# Patient Record
Sex: Female | Born: 1950 | Race: Black or African American | Hispanic: No | Smoking: Never smoker
Health system: Southern US, Community
[De-identification: ages and names within clinical notes are randomized; demographics above are authoritative.]

## PROBLEM LIST (undated history)

## (undated) DIAGNOSIS — I10 Essential (primary) hypertension: Secondary | ICD-10-CM

## (undated) DIAGNOSIS — I2510 Atherosclerotic heart disease of native coronary artery without angina pectoris: Secondary | ICD-10-CM

## (undated) DIAGNOSIS — E785 Hyperlipidemia, unspecified: Secondary | ICD-10-CM

## (undated) DIAGNOSIS — E119 Type 2 diabetes mellitus without complications: Secondary | ICD-10-CM

## (undated) DIAGNOSIS — I519 Heart disease, unspecified: Secondary | ICD-10-CM

## (undated) HISTORY — PX: ABDOMINAL HYSTERECTOMY: SHX81

## (undated) HISTORY — PX: CATARACT EXTRACTION: SUR2

## (undated) HISTORY — DX: Atherosclerotic heart disease of native coronary artery without angina pectoris: I25.10

## (undated) HISTORY — DX: Heart disease, unspecified: I51.9

## (undated) HISTORY — DX: Essential (primary) hypertension: I10

## (undated) HISTORY — DX: Type 2 diabetes mellitus without complications: E11.9

## (undated) HISTORY — PX: CARDIAC CATHETERIZATION: SHX172

## (undated) HISTORY — DX: Hyperlipidemia, unspecified: E78.5

## (undated) HISTORY — PX: KNEE SURGERY: SHX244

---

## 2018-12-06 DIAGNOSIS — E1165 Type 2 diabetes mellitus with hyperglycemia: Secondary | ICD-10-CM | POA: Insufficient documentation

## 2019-11-12 ENCOUNTER — Other Ambulatory Visit: Payer: Self-pay

## 2019-11-12 DIAGNOSIS — Z20822 Contact with and (suspected) exposure to covid-19: Secondary | ICD-10-CM

## 2019-11-13 LAB — NOVEL CORONAVIRUS, NAA: SARS-CoV-2, NAA: NOT DETECTED

## 2019-11-14 ENCOUNTER — Other Ambulatory Visit (HOSPITAL_COMMUNITY): Payer: Self-pay | Admitting: Student

## 2019-11-14 ENCOUNTER — Other Ambulatory Visit: Payer: Self-pay | Admitting: Student

## 2019-11-14 DIAGNOSIS — M549 Dorsalgia, unspecified: Secondary | ICD-10-CM

## 2019-11-14 DIAGNOSIS — M25559 Pain in unspecified hip: Secondary | ICD-10-CM

## 2019-11-25 ENCOUNTER — Ambulatory Visit (HOSPITAL_COMMUNITY): Payer: Medicare (Managed Care)

## 2019-12-03 ENCOUNTER — Encounter (HOSPITAL_COMMUNITY): Payer: Self-pay

## 2019-12-03 ENCOUNTER — Ambulatory Visit (HOSPITAL_COMMUNITY): Payer: Medicare (Managed Care)

## 2020-06-24 ENCOUNTER — Other Ambulatory Visit: Payer: Self-pay | Admitting: Urgent Care

## 2020-06-24 DIAGNOSIS — Z1231 Encounter for screening mammogram for malignant neoplasm of breast: Secondary | ICD-10-CM

## 2020-08-29 NOTE — Progress Notes (Signed)
Patient did not show for appointment.   

## 2020-08-31 ENCOUNTER — Encounter: Payer: Medicare Other | Admitting: Family

## 2021-04-12 ENCOUNTER — Other Ambulatory Visit: Payer: Self-pay | Admitting: Internal Medicine

## 2021-04-12 DIAGNOSIS — M810 Age-related osteoporosis without current pathological fracture: Secondary | ICD-10-CM

## 2021-04-12 DIAGNOSIS — Z1231 Encounter for screening mammogram for malignant neoplasm of breast: Secondary | ICD-10-CM

## 2021-04-30 ENCOUNTER — Ambulatory Visit
Admission: RE | Admit: 2021-04-30 | Discharge: 2021-04-30 | Disposition: A | Discharge status: Home / Self Care | Payer: Medicare Other | Source: Ambulatory Visit | Attending: Internal Medicine | Admitting: Internal Medicine

## 2021-04-30 DIAGNOSIS — Z1231 Encounter for screening mammogram for malignant neoplasm of breast: Secondary | ICD-10-CM

## 2021-05-03 ENCOUNTER — Other Ambulatory Visit: Payer: Self-pay | Admitting: Internal Medicine

## 2021-06-22 ENCOUNTER — Ambulatory Visit
Admission: RE | Admit: 2021-06-22 | Discharge: 2021-06-22 | Disposition: A | Discharge status: Home / Self Care | Payer: Medicare Other | Source: Ambulatory Visit | Attending: Internal Medicine | Admitting: Internal Medicine

## 2021-06-22 ENCOUNTER — Other Ambulatory Visit: Payer: Self-pay | Admitting: Internal Medicine

## 2021-06-22 DIAGNOSIS — R52 Pain, unspecified: Secondary | ICD-10-CM

## 2021-07-23 ENCOUNTER — Other Ambulatory Visit: Payer: Self-pay | Admitting: Internal Medicine

## 2021-08-06 ENCOUNTER — Other Ambulatory Visit: Payer: Self-pay | Admitting: Internal Medicine

## 2021-08-06 DIAGNOSIS — Z1231 Encounter for screening mammogram for malignant neoplasm of breast: Secondary | ICD-10-CM

## 2021-08-18 ENCOUNTER — Ambulatory Visit: Payer: Medicare Other | Admitting: Podiatry

## 2021-09-02 ENCOUNTER — Other Ambulatory Visit: Payer: Medicare Other

## 2021-09-03 ENCOUNTER — Ambulatory Visit: Payer: Medicare Other | Admitting: Podiatry

## 2021-09-03 DIAGNOSIS — B351 Tinea unguium: Secondary | ICD-10-CM | POA: Diagnosis not present

## 2021-09-03 DIAGNOSIS — M79674 Pain in right toe(s): Secondary | ICD-10-CM

## 2021-09-03 DIAGNOSIS — M79675 Pain in left toe(s): Secondary | ICD-10-CM

## 2021-09-03 NOTE — Progress Notes (Signed)
  Subjective:  Patient ID: Debbie Gonzales, female    DOB: [birth year],  MRN: [id number]  No chief complaint on file.  71 y.o. female returns for the above complaint.  Patient presents with thickened elongated dystrophic toenails x10 mild pain on palpation.  Hurts with ambulation.  Objective:  There were no vitals filed for this visit. Podiatric Exam: Vascular: dorsalis pedis and posterior tibial pulses are palpable bilateral. Capillary return is immediate. Temperature gradient is WNL. Skin turgor WNL  Sensorium: Normal Semmes Weinstein monofilament test. Normal tactile sensation bilaterally. Nail Exam: Pt has thick disfigured discolored nails with subungual debris noted bilateral entire nail hallux through fifth toenails.  Pain on palpation to the nails. Ulcer Exam: There is no evidence of ulcer or pre-ulcerative changes or infection. Orthopedic Exam: Muscle tone and strength are WNL. No limitations in general ROM. No crepitus or effusions noted.  Skin: No Porokeratosis. No infection or ulcers    Assessment & Plan:   1. Pain due to onychomycosis of toenails of both feet     Patient was evaluated and treated and all questions answered.  Onychomycosis with pain  -Nails palliatively debrided as below. -Educated on self-care  Procedure: Nail Debridement Rationale: pain  Type of Debridement: manual, sharp debridement. Instrumentation: Nail nipper, rotary burr. Number of Nails: 10  Procedures and Treatment: Consent by patient was obtained for treatment procedures. The patient understood the discussion of treatment and procedures well. All questions were answered thoroughly reviewed. Debridement of mycotic and hypertrophic toenails, 1 through 5 bilateral and clearing of subungual debris. No ulceration, no infection noted.  Return Visit-Office Procedure: Patient instructed to return to the office for a follow up visit 3 months for continued evaluation and treatment.  Nicholes Rough,  DPM    No follow-ups on file.

## 2021-12-22 DIAGNOSIS — E1169 Type 2 diabetes mellitus with other specified complication: Secondary | ICD-10-CM | POA: Diagnosis not present

## 2021-12-22 DIAGNOSIS — R2689 Other abnormalities of gait and mobility: Secondary | ICD-10-CM | POA: Diagnosis not present

## 2021-12-22 DIAGNOSIS — E7800 Pure hypercholesterolemia, unspecified: Secondary | ICD-10-CM | POA: Diagnosis not present

## 2022-01-06 DIAGNOSIS — E119 Type 2 diabetes mellitus without complications: Secondary | ICD-10-CM | POA: Diagnosis not present

## 2022-01-06 DIAGNOSIS — H52203 Unspecified astigmatism, bilateral: Secondary | ICD-10-CM | POA: Diagnosis not present

## 2022-01-06 DIAGNOSIS — Z961 Presence of intraocular lens: Secondary | ICD-10-CM | POA: Diagnosis not present

## 2022-01-06 DIAGNOSIS — H524 Presbyopia: Secondary | ICD-10-CM | POA: Diagnosis not present

## 2022-01-06 DIAGNOSIS — Z7984 Long term (current) use of oral hypoglycemic drugs: Secondary | ICD-10-CM | POA: Diagnosis not present

## 2022-01-26 DIAGNOSIS — D8489 Other immunodeficiencies: Secondary | ICD-10-CM | POA: Diagnosis not present

## 2022-01-26 DIAGNOSIS — Z0289 Encounter for other administrative examinations: Secondary | ICD-10-CM | POA: Diagnosis not present

## 2022-01-26 DIAGNOSIS — J300 Vasomotor rhinitis: Secondary | ICD-10-CM | POA: Diagnosis not present

## 2022-04-05 NOTE — Progress Notes (Deleted)
  Subjective:    Debbie Gonzales - 72 y.o. female MRN [id number]  Date of birth: May 12, 1950  HPI  Debbie Gonzales is to establish care.     Current issues and/or concerns: Ophthalmology  Podiatry   ROS per HPI     Health Maintenance:  Health Maintenance Due  Topic Date Due   Medicare Annual Wellness (AWV)  Never done   COVID-19 Vaccine (1) Never done   Hepatitis C Screening  Never done   DTaP/Tdap/Td (1 - Tdap) Never done   COLONOSCOPY (Pts 45-64yrs Insurance coverage will need to be confirmed)  Never done   Zoster Vaccines- Shingrix (1 of 2) Never done   Pneumonia Vaccine 49+ Years old (1 - PCV) Never done   DEXA SCAN  Never done   INFLUENZA VACCINE  10/05/2021     Past Medical History: There are no problems to display for this patient.     Social History      Family History  family history is not on file.   Medications: reviewed and updated   Objective:   Physical Exam There were no vitals taken for this visit. Physical Exam      Assessment & Plan:         Patient was given clear instructions to go to Emergency Department or return to medical center if symptoms don't improve, worsen, or new problems develop.The patient verbalized understanding.  I discussed the assessment and treatment plan with the patient. The patient was provided an opportunity to ask questions and all were answered. The patient agreed with the plan and demonstrated an understanding of the instructions.   The patient was advised to call back or seek an in-person evaluation if the symptoms worsen or if the condition fails to improve as anticipated.    Durene Fruits, NP 04/05/2022, 11:58 AM Primary Care at [facility]

## 2022-04-07 DIAGNOSIS — M25551 Pain in right hip: Secondary | ICD-10-CM | POA: Diagnosis not present

## 2022-04-07 DIAGNOSIS — M1611 Unilateral primary osteoarthritis, right hip: Secondary | ICD-10-CM | POA: Diagnosis not present

## 2022-04-08 ENCOUNTER — Ambulatory Visit: Payer: Medicare Other | Admitting: Family Medicine

## 2022-04-08 DIAGNOSIS — Z7689 Persons encountering health services in other specified circumstances: Secondary | ICD-10-CM

## 2022-04-12 DIAGNOSIS — L853 Xerosis cutis: Secondary | ICD-10-CM | POA: Diagnosis not present

## 2022-04-12 DIAGNOSIS — Z008 Encounter for other general examination: Secondary | ICD-10-CM | POA: Diagnosis not present

## 2022-04-12 DIAGNOSIS — Z760 Encounter for issue of repeat prescription: Secondary | ICD-10-CM | POA: Diagnosis not present

## 2022-04-26 DIAGNOSIS — Z23 Encounter for immunization: Secondary | ICD-10-CM | POA: Diagnosis not present

## 2022-04-26 DIAGNOSIS — I2510 Atherosclerotic heart disease of native coronary artery without angina pectoris: Secondary | ICD-10-CM | POA: Diagnosis not present

## 2022-04-26 DIAGNOSIS — M169 Osteoarthritis of hip, unspecified: Secondary | ICD-10-CM | POA: Diagnosis not present

## 2022-04-26 DIAGNOSIS — E1165 Type 2 diabetes mellitus with hyperglycemia: Secondary | ICD-10-CM | POA: Diagnosis not present

## 2022-04-26 DIAGNOSIS — I10 Essential (primary) hypertension: Secondary | ICD-10-CM | POA: Diagnosis not present

## 2022-04-29 DIAGNOSIS — E559 Vitamin D deficiency, unspecified: Secondary | ICD-10-CM | POA: Diagnosis not present

## 2022-04-29 DIAGNOSIS — I10 Essential (primary) hypertension: Secondary | ICD-10-CM | POA: Diagnosis not present

## 2022-04-29 DIAGNOSIS — I2510 Atherosclerotic heart disease of native coronary artery without angina pectoris: Secondary | ICD-10-CM | POA: Diagnosis not present

## 2022-04-29 DIAGNOSIS — E1165 Type 2 diabetes mellitus with hyperglycemia: Secondary | ICD-10-CM | POA: Diagnosis not present

## 2022-05-03 DIAGNOSIS — Z1331 Encounter for screening for depression: Secondary | ICD-10-CM | POA: Diagnosis not present

## 2022-05-03 DIAGNOSIS — I2510 Atherosclerotic heart disease of native coronary artery without angina pectoris: Secondary | ICD-10-CM | POA: Diagnosis not present

## 2022-05-03 DIAGNOSIS — Z0000 Encounter for general adult medical examination without abnormal findings: Secondary | ICD-10-CM | POA: Diagnosis not present

## 2022-05-03 DIAGNOSIS — I10 Essential (primary) hypertension: Secondary | ICD-10-CM | POA: Diagnosis not present

## 2022-05-03 DIAGNOSIS — Z1339 Encounter for screening examination for other mental health and behavioral disorders: Secondary | ICD-10-CM | POA: Diagnosis not present

## 2022-05-03 DIAGNOSIS — E1165 Type 2 diabetes mellitus with hyperglycemia: Secondary | ICD-10-CM | POA: Diagnosis not present

## 2022-05-03 DIAGNOSIS — R55 Syncope and collapse: Secondary | ICD-10-CM | POA: Diagnosis not present

## 2022-05-11 ENCOUNTER — Other Ambulatory Visit: Payer: Self-pay | Admitting: Family Medicine

## 2022-05-11 DIAGNOSIS — I129 Hypertensive chronic kidney disease with stage 1 through stage 4 chronic kidney disease, or unspecified chronic kidney disease: Secondary | ICD-10-CM | POA: Diagnosis not present

## 2022-05-11 DIAGNOSIS — R7989 Other specified abnormal findings of blood chemistry: Secondary | ICD-10-CM | POA: Diagnosis not present

## 2022-05-11 DIAGNOSIS — I10 Essential (primary) hypertension: Secondary | ICD-10-CM | POA: Diagnosis not present

## 2022-05-11 DIAGNOSIS — I2510 Atherosclerotic heart disease of native coronary artery without angina pectoris: Secondary | ICD-10-CM | POA: Diagnosis not present

## 2022-05-11 DIAGNOSIS — E1165 Type 2 diabetes mellitus with hyperglycemia: Secondary | ICD-10-CM | POA: Diagnosis not present

## 2022-05-11 DIAGNOSIS — E1121 Type 2 diabetes mellitus with diabetic nephropathy: Secondary | ICD-10-CM | POA: Diagnosis not present

## 2022-05-11 DIAGNOSIS — E559 Vitamin D deficiency, unspecified: Secondary | ICD-10-CM | POA: Diagnosis not present

## 2022-05-11 DIAGNOSIS — E1122 Type 2 diabetes mellitus with diabetic chronic kidney disease: Secondary | ICD-10-CM | POA: Diagnosis not present

## 2022-05-11 DIAGNOSIS — R55 Syncope and collapse: Secondary | ICD-10-CM

## 2022-05-11 DIAGNOSIS — E782 Mixed hyperlipidemia: Secondary | ICD-10-CM | POA: Diagnosis not present

## 2022-05-18 ENCOUNTER — Ambulatory Visit
Admission: RE | Admit: 2022-05-18 | Discharge: 2022-05-18 | Disposition: A | Discharge status: Home / Self Care | Payer: No Typology Code available for payment source | Source: Ambulatory Visit | Attending: Family Medicine | Admitting: Family Medicine

## 2022-05-18 DIAGNOSIS — R55 Syncope and collapse: Secondary | ICD-10-CM

## 2022-05-31 DIAGNOSIS — J309 Allergic rhinitis, unspecified: Secondary | ICD-10-CM | POA: Diagnosis not present

## 2022-05-31 DIAGNOSIS — E1122 Type 2 diabetes mellitus with diabetic chronic kidney disease: Secondary | ICD-10-CM | POA: Diagnosis not present

## 2022-05-31 DIAGNOSIS — I10 Essential (primary) hypertension: Secondary | ICD-10-CM | POA: Diagnosis not present

## 2022-05-31 DIAGNOSIS — F411 Generalized anxiety disorder: Secondary | ICD-10-CM | POA: Diagnosis not present

## 2022-05-31 DIAGNOSIS — G4700 Insomnia, unspecified: Secondary | ICD-10-CM | POA: Diagnosis not present

## 2022-05-31 DIAGNOSIS — I129 Hypertensive chronic kidney disease with stage 1 through stage 4 chronic kidney disease, or unspecified chronic kidney disease: Secondary | ICD-10-CM | POA: Diagnosis not present

## 2022-05-31 DIAGNOSIS — F331 Major depressive disorder, recurrent, moderate: Secondary | ICD-10-CM | POA: Diagnosis not present

## 2022-05-31 DIAGNOSIS — E782 Mixed hyperlipidemia: Secondary | ICD-10-CM | POA: Diagnosis not present

## 2022-06-06 ENCOUNTER — Other Ambulatory Visit: Payer: Self-pay | Admitting: Family Medicine

## 2022-06-06 DIAGNOSIS — Z1231 Encounter for screening mammogram for malignant neoplasm of breast: Secondary | ICD-10-CM

## 2022-06-09 ENCOUNTER — Other Ambulatory Visit: Payer: Self-pay | Admitting: Family Medicine

## 2022-06-09 ENCOUNTER — Encounter: Payer: Self-pay | Admitting: Podiatry

## 2022-06-09 ENCOUNTER — Ambulatory Visit
Admission: RE | Admit: 2022-06-09 | Discharge: 2022-06-09 | Disposition: A | Discharge status: Home / Self Care | Payer: No Typology Code available for payment source | Source: Ambulatory Visit | Attending: Family Medicine | Admitting: Family Medicine

## 2022-06-09 ENCOUNTER — Ambulatory Visit (INDEPENDENT_AMBULATORY_CARE_PROVIDER_SITE_OTHER): Payer: No Typology Code available for payment source | Admitting: Podiatry

## 2022-06-09 DIAGNOSIS — B351 Tinea unguium: Secondary | ICD-10-CM

## 2022-06-09 DIAGNOSIS — Z1231 Encounter for screening mammogram for malignant neoplasm of breast: Secondary | ICD-10-CM

## 2022-06-09 DIAGNOSIS — I10 Essential (primary) hypertension: Secondary | ICD-10-CM

## 2022-06-09 DIAGNOSIS — M79675 Pain in left toe(s): Secondary | ICD-10-CM

## 2022-06-09 DIAGNOSIS — M79674 Pain in right toe(s): Secondary | ICD-10-CM

## 2022-06-09 DIAGNOSIS — R55 Syncope and collapse: Secondary | ICD-10-CM

## 2022-06-09 NOTE — Progress Notes (Signed)
Patient presents subjective:   Patient ID: Debbie Gonzales, female   DOB: 72 y.o.   MRN: [id number]   HPI Was severely elongated nailbeds 1-5 both feet that are thick and can become painful   ROS      Objective:  Physical Exam  Neuro vas alert status intact thick yellow brittle nailbeds 1-5 both feet painful     Assessment:  Chronic mycotic nail infection with pain 1-5 both feet     Plan:  Debridement of nailbeds 1-5 both feet neurogenic bleeding reappoint routine care

## 2022-06-17 ENCOUNTER — Ambulatory Visit
Admission: RE | Admit: 2022-06-17 | Discharge: 2022-06-17 | Disposition: A | Discharge status: Home / Self Care | Payer: No Typology Code available for payment source | Source: Ambulatory Visit | Attending: Family Medicine | Admitting: Family Medicine

## 2022-06-17 DIAGNOSIS — I10 Essential (primary) hypertension: Secondary | ICD-10-CM

## 2022-06-17 DIAGNOSIS — R55 Syncope and collapse: Secondary | ICD-10-CM

## 2022-06-17 DIAGNOSIS — I6529 Occlusion and stenosis of unspecified carotid artery: Secondary | ICD-10-CM | POA: Diagnosis not present

## 2022-06-17 MED ORDER — IOPAMIDOL (ISOVUE-370) INJECTION 76%
75.0000 mL | Freq: Once | INTRAVENOUS | Status: AC | PRN
  Administered 2022-06-17: 75 mL via INTRAVENOUS

## 2022-06-20 DIAGNOSIS — I10 Essential (primary) hypertension: Secondary | ICD-10-CM | POA: Diagnosis not present

## 2022-06-23 DIAGNOSIS — Z23 Encounter for immunization: Secondary | ICD-10-CM | POA: Diagnosis not present

## 2022-06-23 DIAGNOSIS — I10 Essential (primary) hypertension: Secondary | ICD-10-CM | POA: Diagnosis not present

## 2022-06-23 DIAGNOSIS — I6509 Occlusion and stenosis of unspecified vertebral artery: Secondary | ICD-10-CM | POA: Diagnosis not present

## 2022-06-23 DIAGNOSIS — E1121 Type 2 diabetes mellitus with diabetic nephropathy: Secondary | ICD-10-CM | POA: Diagnosis not present

## 2022-06-23 DIAGNOSIS — I6529 Occlusion and stenosis of unspecified carotid artery: Secondary | ICD-10-CM | POA: Diagnosis not present

## 2022-06-23 DIAGNOSIS — E1165 Type 2 diabetes mellitus with hyperglycemia: Secondary | ICD-10-CM | POA: Diagnosis not present

## 2022-06-27 NOTE — Progress Notes (Deleted)
  Subjective:    Debbie Gonzales - 72 y.o. female MRN [id number]  Date of birth: Feb 15, 1951  HPI  Debbie Gonzales is to establish care.   Current issues and/or concerns: Podiatry   ROS per HPI     Health Maintenance:  Health Maintenance Due  Topic Date Due   Medicare Annual Wellness (AWV)  Never done   COVID-19 Vaccine (1) Never done   Hepatitis C Screening  Never done   DTaP/Tdap/Td (1 - Tdap) Never done   COLONOSCOPY (Pts 45-74yrs Insurance coverage will need to be confirmed)  Never done   Zoster Vaccines- Shingrix (1 of 2) Never done   Pneumonia Vaccine 30+ Years old (1 of 1 - PCV) Never done   DEXA SCAN  Never done     Past Medical History: There are no problems to display for this patient.     Social History      Family History  family history is not on file.   Medications: reviewed and updated   Objective:   Physical Exam There were no vitals taken for this visit. Physical Exam      Assessment & Plan:         Patient was given clear instructions to go to Emergency Department or return to medical center if symptoms don't improve, worsen, or new problems develop.The patient verbalized understanding.  I discussed the assessment and treatment plan with the patient. The patient was provided an opportunity to ask questions and all were answered. The patient agreed with the plan and demonstrated an understanding of the instructions.   The patient was advised to call back or seek an in-person evaluation if the symptoms worsen or if the condition fails to improve as anticipated.    Ricky Stabs, NP 06/27/2022, 12:02 PM Primary Care at [facility]

## 2022-07-05 ENCOUNTER — Ambulatory Visit: Payer: No Typology Code available for payment source | Admitting: Family

## 2022-07-05 DIAGNOSIS — Z7689 Persons encountering health services in other specified circumstances: Secondary | ICD-10-CM

## 2022-07-07 ENCOUNTER — Ambulatory Visit (INDEPENDENT_AMBULATORY_CARE_PROVIDER_SITE_OTHER): Payer: No Typology Code available for payment source | Admitting: Neurology

## 2022-07-07 VITALS — BP 121/70 | HR 87 | Ht 62.0 in | Wt 184.4 lb

## 2022-07-07 DIAGNOSIS — Z8659 Personal history of other mental and behavioral disorders: Secondary | ICD-10-CM | POA: Diagnosis not present

## 2022-07-07 DIAGNOSIS — Z0389 Encounter for observation for other suspected diseases and conditions ruled out: Secondary | ICD-10-CM | POA: Diagnosis not present

## 2022-07-07 DIAGNOSIS — Z0001 Encounter for general adult medical examination with abnormal findings: Secondary | ICD-10-CM | POA: Diagnosis not present

## 2022-07-07 DIAGNOSIS — Z79899 Other long term (current) drug therapy: Secondary | ICD-10-CM | POA: Diagnosis not present

## 2022-07-07 DIAGNOSIS — R799 Abnormal finding of blood chemistry, unspecified: Secondary | ICD-10-CM | POA: Diagnosis not present

## 2022-07-07 DIAGNOSIS — I6501 Occlusion and stenosis of right vertebral artery: Secondary | ICD-10-CM

## 2022-07-07 NOTE — Progress Notes (Signed)
[facility] [address] [place]. [place] [address] [phone]       OFFICE CONSULT NOTE  Ms. Debbie Gonzales Date of Birth:  31-Mar-1950 Medical Record Number:  [id number]   Referring MD: Maryelizabeth Rowan  Reason for Referral: Abnormal CT angiogram  HPI: Debbie Gonzales is a 72 year old African-American lady seen today for initial office consultation visit.  History is obtained from her and review of electronic medical records and I personally reviewed pertinent available imaging films in PACS.  Patient is not a very great historian.  She is naviculars she close Dr. 3 that she had previous episodes of seizures and disorientation 5 years ago with the last one being 8 months ago.  Patient with episodes.  Continues disorientation does not recognize does not have significant headaches or focal neurological symptoms.  The first 1 occurred when her mother died and patient went to the funeral but was wondering why she was  not sitting next to her.  After the funeral ceremony was over she  refused to get up and leave stating that acetaminophen has not been started  .  She had a somewhat similar episode  8 months ago and she was confused and her daughter had to be called to come and take her home Patient denies any headache, vertigo, dizziness, double vision, extremity or numbness during these episodes. She denies any prior history of strokes, TIAs, seizures, migraines, significant head injury with loss of consciousness. She underwent MR angiogram of the brain on 05/18/2022 which showed moderate stenosis of the proximal right M2 and distal right vertebral artery system poststenotic dilatation.  CT angiogram on 06/17/2022 showed intact most severe narrowing of the right dominant vertebral artery with tandem severe stenosis.  There was mild to moderate bilateral MCA stenosis and moderate left and mild right carotid siphon stenosis as well.  Patient has been on aspirin for stroke prevention  does take her medicines for hyperlipidemia diabetes, hypertension which all appear under good control. ROS:   14 system review of systems is positive for confusion, disorientation, memory difficulties, all other systems negative  PMH: No past medical history on file.  Social History:  Social History   Socioeconomic History   Marital status: Single    Spouse name: Not on file   Number of children: Not on file   Years of education: Not on file   Highest education level: Not on file  Occupational History   Not on file  Tobacco Use   Smoking status: Not on file   Smokeless tobacco: Not on file  Substance and Sexual Activity   Alcohol use: Not on file   Drug use: Not on file   Sexual activity: Not on file  Other Topics Concern   Not on file  Social History Narrative   Not on file   Social Determinants of Health   Financial Resource Strain: Not on file  Food Insecurity: Not on file  Transportation Needs: Not on file  Physical Activity: Not on file  Stress: Not on file  Social Connections: Not on file  Intimate Partner Violence: Not on file    Medications:   Current Outpatient Medications on File Prior to Visit  Medication Sig Dispense Refill   aspirin EC 81 MG tablet Take 81 mg by mouth daily.     atenolol (TENORMIN) 50 MG tablet Take 50 mg by mouth daily.     atorvastatin (LIPITOR) 40 MG tablet Take 40 mg by mouth daily.  benzonatate (TESSALON) 100 MG capsule Take 100 mg by mouth 2 (two) times daily as needed.     carvedilol (COREG) 3.125 MG tablet Take 3.125 mg by mouth 2 (two) times daily.     CELEBREX 200 MG capsule Take by mouth.     cephALEXin (KEFLEX) 500 MG capsule Take 500 mg by mouth 2 (two) times daily.     Cholecalciferol (VITAMIN D3) 25 MCG (1000 UT) CAPS Take 1 capsule by mouth daily.     diclofenac Sodium (VOLTAREN) 1 % GEL Apply 2 g of 1% gel to affected area 4 times daily.     famotidine (PEPCID) 20 MG tablet Take by mouth.     fluticasone (FLONASE)  50 MCG/ACT nasal spray Place into both nostrils.     glipiZIDE (GLUCOTROL) 10 MG tablet glipizide 10 mg tablet  TAKE 1 TABLET TWICE A DAY BY ORAL ROUTE FOR 90 DAYS.     hydrochlorothiazide (HYDRODIURIL) 25 MG tablet Take 25 mg by mouth daily.     Lancet Devices (ONETOUCH DELICA PLUS LANCING) MISC      Lancets (ONETOUCH DELICA PLUS LANCET33G) MISC Apply topically.     losartan (COZAAR) 100 MG tablet Take 100 mg by mouth daily.     meloxicam (MOBIC) 7.5 MG tablet meloxicam 7.5 mg tablet  TAKE 1 TABLET BY MOUTH EVERY DAY WITH MEALS     metFORMIN (GLUCOPHAGE) 1000 MG tablet Take 1,000 mg by mouth daily.     omeprazole (PRILOSEC) 20 MG capsule Take by mouth.     ONETOUCH ULTRA test strip      sitaGLIPtin-metformin (JANUMET) 50-1000 MG tablet Take by mouth.     No current facility-administered medications on file prior to visit.    Allergies:  Not on File  Physical Exam General: well developed, well nourished pleasant elderly African-American lady, seated, in no evident distress Head: head normocephalic and atraumatic.   Neck: supple with no carotid or supraclavicular bruits Cardiovascular: regular rate and rhythm, no murmurs Musculoskeletal: no deformity Skin:  no rash/petichiae Vascular:  Normal pulses all extremities  Neurologic Exam Mental Status: Awake and fully alert. Oriented to place and time. Recent and remote memory intact. Attention span, concentration and fund of knowledge appropriate. Mood and affect appropriate.  Diminished recall 2/3.  Able to name only 8 animals which can walk on 4 legs.  Clock drawing 4/4. Cranial Nerves: Fundoscopic exam reveals sharp disc margins. Pupils equal, briskly reactive to light. Extraocular movements full without nystagmus. Visual fields full to confrontation. Hearing intact. Facial sensation intact. Face, tongue, palate moves normally and symmetrically.  Motor: Normal bulk and tone. Normal strength in all tested extremity muscles. Sensory.:  intact to touch , pinprick , position and vibratory sensation.  Coordination: Rapid alternating movements normal in all extremities. Finger-to-nose and heel-to-shin performed accurately bilaterally. Gait and Station: Arises from chair without difficulty. Stance is normal. Gait demonstrates normal stride length and balance . Able to heel, toe and tandem walk with mild difficulty.  Reflexes: 1+ and symmetric. Toes downgoing.   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  History of recurrent episodes of confusion and disorientation of unclear etiology.     PLAN:I had a long discussion with the patient regarding recurrent episodes of transient confusion.  Differential diagnosis and evaluation plan and answered questions.  We also discussed the  results of CT angiogram and MRA brain findings of high-grade stenosis of right vertebral artery and risk for stroke.  I recommend she continue aspirin for stroke prevention and maintain aggressive risk factor modification with strict control of hypertension with blood pressure goal below 140/90, lipids with LDL cholesterol goal below 70 mg percent and diabetes with hemoglobin A1c goal below 6.5%.  Check EEG, CMP, CBC, lipid profile and hemoglobin A1c.  He will return for follow-up in 4 months or call earlier if necessary.  Greater than 50% time during this 45-minute consultation visit was spent on counseling and coordination of care about episodes of confusion, disorientation and findings of vertebral artery stenosis.  Delia Heady, MD Note: This document was prepared with digital dictation and possible smart phrase technology. Any transcriptional errors that result from this process are unintentional.

## 2022-07-07 NOTE — Patient Instructions (Signed)
I had a long discussion with the patient regarding recurrent episodes of transient confusion.  Differential diagnosis and evaluation plan and answered questions.  We also discussed the results of CT angiogram and MRA brain findings of high-grade stenosis of right vertebral artery and risk for stroke.  I recommend she continue aspirin for stroke prevention and maintain aggressive risk factor modification with strict control of hypertension with blood pressure goal below 140/90, lipids with LDL cholesterol goal below 70 mg percent and diabetes with hemoglobin A1c goal below 6.5%.  Check EEG, CMP, CBC, lipid profile and hemoglobin A1c.  He will return for follow-up in 4 months or call earlier if necessary.  Stroke Prevention Some medical conditions and behaviors can lead to a higher chance of having a stroke. You can help prevent a stroke by eating healthy, exercising, not smoking, and managing any medical conditions you have. Stroke is a leading cause of functional impairment. Primary prevention is particularly important because a majority of strokes are first-time events. Stroke changes the lives of not only those who experience a stroke but also their family and other caregivers. How can this condition affect me? A stroke is a medical emergency and should be treated right away. A stroke can lead to brain damage and can sometimes be life-threatening. If a person gets medical treatment right away, there is a better chance of surviving and recovering from a stroke. What can increase my risk? The following medical conditions may increase your risk of a stroke: Cardiovascular disease. High blood pressure (hypertension). Diabetes. High cholesterol. Sickle cell disease. Blood clotting disorders (hypercoagulable state). Obesity. Sleep disorders (obstructive sleep apnea). Other risk factors include: Being older than age 50. Having a history of blood clots, stroke, or mini-stroke (transient ischemic attack,  TIA). Genetic factors, such as race, ethnicity, or a family history of stroke. Smoking cigarettes or using other tobacco products. Taking birth control pills, especially if you also use tobacco. Heavy use of alcohol or drugs, especially cocaine and methamphetamine. Physical inactivity. What actions can I take to prevent this? Manage your health conditions High cholesterol levels. Eating a healthy diet is important for preventing high cholesterol. If cholesterol cannot be managed through diet alone, you may need to take medicines. Take any prescribed medicines to control your cholesterol as told by your health care provider. Hypertension. To reduce your risk of stroke, try to keep your blood pressure below 130/80. Eating a healthy diet and exercising regularly are important for controlling blood pressure. If these steps are not enough to manage your blood pressure, you may need to take medicines. Take any prescribed medicines to control hypertension as told by your health care provider. Ask your health care provider if you should monitor your blood pressure at home. Have your blood pressure checked every year, even if your blood pressure is normal. Blood pressure increases with age and some medical conditions. Diabetes. Eating a healthy diet and exercising regularly are important parts of managing your blood sugar (glucose). If your blood sugar cannot be managed through diet and exercise, you may need to take medicines. Take any prescribed medicines to control your diabetes as told by your health care provider. Get evaluated for obstructive sleep apnea. Talk to your health care provider about getting a sleep evaluation if you snore a lot or have excessive sleepiness. Make sure that any other medical conditions you have, such as atrial fibrillation or atherosclerosis, are managed. Nutrition Follow instructions from your health care provider about what to eat or drink  to help manage your health  condition. These instructions may include: Reducing your daily calorie intake. Limiting how much salt (sodium) you use to 1,500 milligrams (mg) each day. Using only healthy fats for cooking, such as olive oil, canola oil, or sunflower oil. Eating healthy foods. You can do this by: Choosing foods that are high in fiber, such as whole grains, and fresh fruits and vegetables. Eating at least 5 servings of fruits and vegetables a day. Try to fill one-half of your plate with fruits and vegetables at each meal. Choosing lean protein foods, such as lean cuts of meat, poultry without skin, fish, tofu, beans, and nuts. Eating low-fat dairy products. Avoiding foods that are high in sodium. This can help lower blood pressure. Avoiding foods that have saturated fat, trans fat, and cholesterol. This can help prevent high cholesterol. Avoiding processed and prepared foods. Counting your daily carbohydrate intake.  Lifestyle If you drink alcohol: Limit how much you have to: 0-1 drink a day for women who are not pregnant. 0-2 drinks a day for men. Know how much alcohol is in your drink. In the [place] one drink equals one 12 oz bottle of beer ( ), one 5 oz glass of wine ( ), or one 1 oz glass of hard liquor (44mL). Do not use any products that contain nicotine or tobacco. These products include cigarettes, chewing tobacco, and vaping devices, such as e-cigarettes. If you need help quitting, ask your health care provider. Avoid secondhand smoke. Do not use drugs. Activity  Try to stay at a healthy weight. Get at least 30 minutes of exercise on most days, such as: Fast walking. Biking. Swimming. Medicines Take over-the-counter and prescription medicines only as told by your health care provider. Aspirin or blood thinners (antiplatelets or anticoagulants) may be recommended to reduce your risk of forming blood clots that can lead to stroke. Avoid taking birth control pills. Talk to your health  care provider about the risks of taking birth control pills if: You are over 57 years old. You smoke. You get very bad headaches. You have had a blood clot. Where to find more information American Stroke Association: www.strokeassociation.org Get help right away if: You or a loved one has any symptoms of a stroke. "BE FAST" is an easy way to remember the main warning signs of a stroke: B - Balance. Signs are dizziness, sudden trouble walking, or loss of balance. E - Eyes. Signs are trouble seeing or a sudden change in vision. F - Face. Signs are sudden weakness or numbness of the face, or the face or eyelid drooping on one side. A - Arms. Signs are weakness or numbness in an arm. This happens suddenly and usually on one side of the body. S - Speech. Signs are sudden trouble speaking, slurred speech, or trouble understanding what people say. T - Time. Time to call emergency services. Write down what time symptoms started. You or a loved one has other signs of a stroke, such as: A sudden, severe headache with no known cause. Nausea or vomiting. Seizure. These symptoms may represent a serious problem that is an emergency. Do not wait to see if the symptoms will go away. Get medical help right away. Call your local emergency services (911 in the [place] Do not drive yourself to the hospital. Summary You can help to prevent a stroke by eating healthy, exercising, not smoking, limiting alcohol intake, and managing any medical conditions you may have. Do not use any products that contain nicotine  or tobacco. These include cigarettes, chewing tobacco, and vaping devices, such as e-cigarettes. If you need help quitting, ask your health care provider. Remember "BE FAST" for warning signs of a stroke. Get help right away if you or a loved one has any of these signs. This information is not intended to replace advice given to you by your health care provider. Make sure you discuss any questions you have  with your health care provider. Document Revised: 09/05/2019 Document Reviewed: 09/23/2019 Elsevier Patient Education  2023 [employer].

## 2022-07-08 LAB — COMPREHENSIVE METABOLIC PANEL
ALT: 14 IU/L (ref 0–32)
AST: 23 IU/L (ref 0–40)
Albumin/Globulin Ratio: 2.1 (ref 1.2–2.2)
Albumin: 4.1 g/dL (ref 3.8–4.8)
Alkaline Phosphatase: 61 IU/L (ref 44–121)
BUN/Creatinine Ratio: 14 (ref 12–28)
BUN: 16 mg/dL (ref 8–27)
Bilirubin Total: <0.2 mg/dL (ref 0.0–1.2)
CO2: 23 mmol/L (ref 20–29)
Calcium: 9.5 mg/dL (ref 8.7–10.3)
Chloride: 104 mmol/L (ref 96–106)
Creatinine, Ser: 1.12 mg/dL — ABNORMAL HIGH (ref 0.57–1.00)
Globulin, Total: 2 g/dL (ref 1.5–4.5)
Glucose: 159 mg/dL — ABNORMAL HIGH (ref 70–99)
Potassium: 3.4 mmol/L — ABNORMAL LOW (ref 3.5–5.2)
Sodium: 144 mmol/L (ref 134–144)
Total Protein: 6.1 g/dL (ref 6.0–8.5)
eGFR: 52 mL/min/{1.73_m2} — ABNORMAL LOW (ref >59)

## 2022-07-08 LAB — LIPID PANEL
Chol/HDL Ratio: 2.6 ratio (ref 0.0–4.4)
Cholesterol, Total: 137 mg/dL (ref 100–199)
HDL: 53 mg/dL (ref >39)
LDL Chol Calc (NIH): 69 mg/dL (ref 0–99)
Triglycerides: 78 mg/dL (ref 0–149)
VLDL Cholesterol Cal: 15 mg/dL (ref 5–40)

## 2022-07-08 LAB — CBC
Hematocrit: 30.9 % — ABNORMAL LOW (ref 34.0–46.6)
Hemoglobin: 10.1 g/dL — ABNORMAL LOW (ref 11.1–15.9)
MCH: 27 pg (ref 26.6–33.0)
MCHC: 32.7 g/dL (ref 31.5–35.7)
MCV: 83 fL (ref 79–97)
Platelets: 250 10*3/uL (ref 150–450)
RBC: 3.74 x10E6/uL — ABNORMAL LOW (ref 3.77–5.28)
RDW: 15.7 % — ABNORMAL HIGH (ref 11.7–15.4)
WBC: 5.4 10*3/uL (ref 3.4–10.8)

## 2022-07-08 LAB — HEMOGLOBIN A1C
Est. average glucose Bld gHb Est-mCnc: 157 mg/dL
Hgb A1c MFr Bld: 7.1 % — ABNORMAL HIGH (ref 4.8–5.6)

## 2022-07-08 NOTE — Progress Notes (Signed)
Kindly inform the patient that cholesterol profile was satisfactory.  His potassium and blood count with hemoglobin is slightly low and he needs to see his primary care physician for further evaluation and treatment for the same Diabetes control is also not satisfactory and needs to see his primary care physician for medication adjustment

## 2022-07-12 ENCOUNTER — Telehealth: Payer: Self-pay | Admitting: Neurology

## 2022-07-12 ENCOUNTER — Telehealth: Payer: Self-pay

## 2022-07-12 NOTE — Telephone Encounter (Signed)
Contacted pt, informed her that cholesterol profile was satisfactory. Her potassium and blood count with hemoglobin is slightly low. Diabetes control is also not satisfactory and needs to see her primary care physician for medication adjustment and further management.  Advised to call the office back with any questions or concerns as she had none at this time.  Patient verbally understood the results was appreciative for the call.

## 2022-07-12 NOTE — Telephone Encounter (Signed)
-----   Message from Deatra James, RN sent at 07/11/2022  9:37 AM EDT -----  ----- Message ----- From: Micki Riley, MD Sent: 07/08/2022   8:33 AM EDT To: Gna-[facility] 2 Results  Kindly inform the patient that cholesterol profile was satisfactory.  His potassium and blood count with hemoglobin is slightly low and he needs to see his primary care physician for further evaluation and treatment for the same Diabetes control is also not satisfactory and needs to see his primary care physician for medication adjustment

## 2022-07-12 NOTE — Telephone Encounter (Signed)
[facility]kley Harvey: [id number] exp. 07/12/22-09/11/22 sent to GI [phone]

## 2022-07-14 DIAGNOSIS — M1611 Unilateral primary osteoarthritis, right hip: Secondary | ICD-10-CM | POA: Diagnosis not present

## 2022-07-19 ENCOUNTER — Ambulatory Visit: Payer: No Typology Code available for payment source | Admitting: Cardiology

## 2022-07-19 ENCOUNTER — Encounter: Payer: Self-pay | Admitting: Cardiology

## 2022-07-19 VITALS — BP 136/76 | HR 78 | Resp 16 | Ht 62.0 in | Wt 187.6 lb

## 2022-07-19 DIAGNOSIS — I2510 Atherosclerotic heart disease of native coronary artery without angina pectoris: Secondary | ICD-10-CM | POA: Diagnosis not present

## 2022-07-19 DIAGNOSIS — N1831 Chronic kidney disease, stage 3a: Secondary | ICD-10-CM

## 2022-07-19 DIAGNOSIS — I6523 Occlusion and stenosis of bilateral carotid arteries: Secondary | ICD-10-CM | POA: Diagnosis not present

## 2022-07-19 DIAGNOSIS — E7800 Pure hypercholesterolemia, unspecified: Secondary | ICD-10-CM | POA: Diagnosis not present

## 2022-07-19 DIAGNOSIS — R0789 Other chest pain: Secondary | ICD-10-CM | POA: Diagnosis not present

## 2022-07-19 DIAGNOSIS — I10 Essential (primary) hypertension: Secondary | ICD-10-CM

## 2022-07-19 DIAGNOSIS — E1122 Type 2 diabetes mellitus with diabetic chronic kidney disease: Secondary | ICD-10-CM | POA: Diagnosis not present

## 2022-07-19 MED ORDER — CARVEDILOL 6.25 MG PO TABS: 6.2500 mg | ORAL_TABLET | Freq: Two times a day (BID) | ORAL | 3 refills | Status: DC

## 2022-07-19 NOTE — Progress Notes (Addendum)
Primary Physician/Referring:  Lewis Moccasin, MD  Patient ID: Debbie Gonzales, female    DOB: 1950-06-15, 71 y.o.   MRN: [id number]  Chief Complaint  Patient presents with   vascular disease   New Patient (Initial Visit)   HPI:    Debbie Gonzales  is a 72 y.o. African-American female patient with coronary artery disease and stent implantation in 2012 while in [place], hypertension, hypercholesterolemia, diabetes mellitus with stage IIIa chronic kidney disease presenting with episodes of global amnesia for which she underwent CT angiogram of the brain which revealed bilateral ICA stenosis and high-grade tandem right vertebral stenosis, referred to me for cardiac evaluation in view of the vascular disease.  Past Medical History:  Diagnosis Date   Coronary artery disease    Diabetes mellitus without complication (HCC)    Heart disease    Hyperlipidemia    Hypertension    Past Surgical History:  Procedure Laterality Date   ABDOMINAL HYSTERECTOMY     CARDIAC CATHETERIZATION     CATARACT EXTRACTION N/A    KNEE SURGERY Left    Family History  Problem Relation Age of Onset   Heart failure Mother    Heart attack Mother    Diabetes Mother     Social History   Tobacco Use   Smoking status: Never   Smokeless tobacco: Never  Substance Use Topics   Alcohol use: Yes    Comment: occasionally   Marital Status: Single  ROS  Review of Systems  Cardiovascular:  Negative for chest pain, dyspnea on exertion and leg swelling.   Objective      07/19/2022    3:40 PM 07/19/2022    2:02 PM 07/07/2022    3:56 PM  Vitals with BMI  Height  5\' 2"  5\' 2"   Weight  187 lbs 10 oz 184 lbs 6 oz  BMI  34.3 33.72  Systolic 136 169 213  Diastolic 76 75 70  Pulse 78 78 87   SpO2: 94 %  Physical Exam Neck:     Vascular: Carotid bruit (bilateral) present. No JVD.  Cardiovascular:     Rate and Rhythm: Normal rate and regular rhythm.     Pulses: Intact distal pulses.     Heart  sounds: Normal heart sounds. No murmur heard.    No gallop.  Pulmonary:     Effort: Pulmonary effort is normal.     Breath sounds: Normal breath sounds.  Abdominal:     General: Bowel sounds are normal.     Palpations: Abdomen is soft.  Musculoskeletal:     Right lower leg: No edema.     Left lower leg: No edema.     Laboratory examination:   Lab Results  Component Value Date   NA 144 07/07/2022   K 3.4 (L) 07/07/2022   CO2 23 07/07/2022   GLUCOSE 159 (H) 07/07/2022   BUN 16 07/07/2022   CREATININE 1.12 (H) 07/07/2022   CALCIUM 9.5 07/07/2022   EGFR 52 (L) 07/07/2022    Recent Labs    07/07/22 1623  NA 144  K 3.4*  CL 104  CO2 23  GLUCOSE 159*  BUN 16  CREATININE 1.12*  CALCIUM 9.5    Lab Results  Component Value Date   GLUCOSE 159 (H) 07/07/2022   NA 144 07/07/2022   K 3.4 (L) 07/07/2022   CL 104 07/07/2022   CO2 23 07/07/2022   BUN 16 07/07/2022   CREATININE 1.12 (H) 07/07/2022   EGFR  52 (L) 07/07/2022   CALCIUM 9.5 07/07/2022   PROT 6.1 07/07/2022   ALBUMIN 4.1 07/07/2022   LABGLOB 2.0 07/07/2022   AGRATIO 2.1 07/07/2022   BILITOT <0.2 07/07/2022   ALKPHOS 61 07/07/2022   AST 23 07/07/2022   ALT 14 07/07/2022      Lab Results  Component Value Date   ALT 14 07/07/2022   AST 23 07/07/2022   ALKPHOS 61 07/07/2022   BILITOT <0.2 07/07/2022       Latest Ref Rng & Units 07/07/2022    4:23 PM  Hepatic Function  Total Protein 6.0 - 8.5 g/dL 6.1   Albumin 3.8 - 4.8 g/dL 4.1   AST 0 - 40 IU/L 23   ALT 0 - 32 IU/L 14   Alk Phosphatase 44 - 121 IU/L 61   Total Bilirubin 0.0 - 1.2 mg/dL <4.0     Lipid Panel Recent Labs    07/07/22 1623  CHOL 137  TRIG 78  LDLCALC 69  HDL 53  CHOLHDL 2.6    HEMOGLOBIN A1C Lab Results  Component Value Date   HGBA1C 7.1 (H) 07/07/2022   TSH No results for input(s): "TSH" in the last 8760 hours.  External labs:   Labs 06/21/2022:  Serum glucose 132 mg, sodium 142, potassium 3.8, BUN 20, creatinine  1.1, EGFR 53 mL, LFTs normal.  Labs 05/03/2022:  Total cholesterol 152, triglycerides 97, HDL 44, LDL 89.  Hb 11.4/HCT 34.0, platelets 303.  Vitamin D 55.5.  TSH normal at 0.58.  Radiology:   CT angio head 06/21/2022: 1. Intracranial atherosclerosis including severe tandem stenoses of the dominant right vertebral artery. 2. Moderate left and mild right carotid siphon stenoses. 3. Mild-to-moderate bilateral MCA stenoses.  Cardiac Studies:  C oronary artery disease and S/P OM-1stent 2.75x28 mm Xience)implantation in 2015  Regadenoson nuclear stress test 01/17/2019: Perfusion: No decreased activity in the left ventricle on stress  imaging to suggest reversible ischemia or infarction.  Wall Motion: Normal left ventricular wall motion. No left  ventricular dilation. Left Ventricular Ejection Fraction: 68 %    EKG:   EKG 07/19/2022: Normal sinus rhythm at the rate of 78 bpm, left atrial enlargement, normal axis.  Right bundle branch block.   Medications and allergies  Not on File   Medication list   Current Outpatient Medications:    aspirin EC 81 MG tablet, Take 81 mg by mouth daily., Disp: , Rfl:    atorvastatin (LIPITOR) 40 MG tablet, Take 40 mg by mouth daily., Disp: , Rfl:    Cholecalciferol (VITAMIN D3) 25 MCG (1000 UT) CAPS, Take 1 capsule by mouth daily., Disp: , Rfl:    fluticasone (FLONASE) 50 MCG/ACT nasal spray, Place into both nostrils., Disp: , Rfl:    hydrochlorothiazide (HYDRODIURIL) 25 MG tablet, Take 25 mg by mouth daily., Disp: , Rfl:    Lancet Devices (ONETOUCH DELICA PLUS LANCING) MISC, , Disp: , Rfl:    Lancets (ONETOUCH DELICA PLUS LANCET33G) MISC, Apply topically., Disp: , Rfl:    losartan (COZAAR) 100 MG tablet, Take 100 mg by mouth daily., Disp: , Rfl:    metFORMIN (GLUCOPHAGE) 1000 MG tablet, Take 1,000 mg by mouth daily., Disp: , Rfl:    omeprazole (PRILOSEC) 20 MG capsule, Take by mouth., Disp: , Rfl:    ONETOUCH ULTRA test strip, , Disp: , Rfl:     carvedilol (COREG) 6.25 MG tablet, Take 1 tablet (6.25 mg total) by mouth 2 (two) times daily., Disp: 180 tablet, Rfl: 3  Assessment     ICD-10-CM   1. Coronary artery disease involving native coronary artery of native heart without angina pectoris  I25.10 EKG 12-Lead    carvedilol (COREG) 6.25 MG tablet    2. Primary hypertension  I10 carvedilol (COREG) 6.25 MG tablet    3. Pure hypercholesterolemia  E78.00     4. Type 2 diabetes mellitus with stage 3a chronic kidney disease, without long-term current use of insulin (HCC)  E11.22    N18.31     5. Asymptomatic bilateral carotid artery stenosis  I65.23 PCV CAROTID DUPLEX (BILATERAL)       Orders Placed This Encounter  Procedures   EKG 12-Lead    Meds ordered this encounter  Medications   carvedilol (COREG) 6.25 MG tablet    Sig: Take 1 tablet (6.25 mg total) by mouth 2 (two) times daily.    Dispense:  180 tablet    Refill:  3    Medications Discontinued During This Encounter  Medication Reason   benzonatate (TESSALON) 100 MG capsule    CELEBREX 200 MG capsule    cephALEXin (KEFLEX) 500 MG capsule    diclofenac Sodium (VOLTAREN) 1 % GEL    famotidine (PEPCID) 20 MG tablet    glipiZIDE (GLUCOTROL) 10 MG tablet    meloxicam (MOBIC) 7.5 MG tablet    sitaGLIPtin-metformin (JANUMET) 50-1000 MG tablet    atenolol (TENORMIN) 50 MG tablet    carvedilol (COREG) 3.125 MG tablet Reorder     Recommendations:   Debbie Gonzales is a 72 y.o.African-American female patient with coronary artery disease and S/P OM-1stent 2.75x28 mm Xience)implantation in 2015 while in [place], hypertension, hypercholesterolemia, diabetes mellitus with stage IIIa chronic kidney disease presenting with episodes of global amnesia for which she underwent CT angiogram of the brain which revealed bilateral ICA stenosis and high-grade tandem right vertebral stenosis, referred to me for cardiac evaluation in view of the vascular disease.  1. Coronary artery  disease involving native coronary artery of native heart without angina pectoris Patient has had what appears to be NSTEMI and underwent angioplasty and stenting in 2012 while in [place], details not available, patient referred to me for establishing cardiac care.  She remains angina free, she has had a nuclear stress test in 2020 which was nonischemic with no wall motion abnormality. Her EKG is normal, blood pressure is elevated and also for cardiovascular protection will increase her carvedilol from 3.125 mg to 6.25 mg p.o. twice daily. - EKG 12-Lead - carvedilol (COREG) 6.25 MG tablet; Take 1 tablet (6.25 mg total) by mouth 2 (two) times daily.  Dispense: 180 tablet; Refill: 3  2. Primary hypertension Blood pressure was markedly elevated upon presentation but improved upon sitting, however still not at goal with >130 mmHg.  Coreg has been increased. - carvedilol (COREG) 6.25 MG tablet; Take 1 tablet (6.25 mg total) by mouth 2 (two) times daily.  Dispense: 180 tablet; Refill: 3  3. Pure hypercholesterolemia Lipids under excellent control with recent modification.  Reviewed external labs.  4. Type 2 diabetes mellitus with stage 3a chronic kidney disease, without long-term current use of insulin (HCC) Patient has stage IIIa chronic kidney disease, renal function has remained stable.  She is presently on 100 mg of losartan, continue the same.  5. Asymptomatic bilateral carotid artery stenosis I reviewed the CT angiogram of the head, she has prominent bilateral carotid bruit, will obtain carotid artery duplex to establish a baseline.  Patient is extremely complex, I will try to reconcile  her medications and also her problem list from multiple providers, I would like to see her back in 6 weeks for follow-up.  I also reviewed the notes from Dr. Pearlean Brownie, neurology.  This was a complex evaluation of the patient with multiple medical comorbidity and multiple physician engagement.  - PCV CAROTID DUPLEX  (BILATERAL); Future   Yates Decamp, MD, [facility] 07/20/2022, 5:04 PM Office: [phone]

## 2022-07-20 ENCOUNTER — Encounter: Payer: Self-pay | Admitting: Cardiology

## 2022-07-21 ENCOUNTER — Other Ambulatory Visit: Payer: Self-pay

## 2022-07-21 ENCOUNTER — Ambulatory Visit: Payer: No Typology Code available for payment source

## 2022-07-21 DIAGNOSIS — I6523 Occlusion and stenosis of bilateral carotid arteries: Secondary | ICD-10-CM

## 2022-07-25 NOTE — Progress Notes (Signed)
Carotid artery duplex 07/21/2022: Duplex suggests stenosis in the right internal carotid artery (1-15%). < 50% stenosis in the right external carotid artery. Duplex suggests stenosis in the left internal carotid artery (1-15%). <50%stenosis in the left external carotid artery. Mild diffuse homogeneous plaque noted in the bilateral carotid arteries. No significant stenosis noted in the extracranial cerebral vessels. Antegrade right vertebral artery flow. Antegrade left vertebral artery flow.  Will discuss on OV soon

## 2022-07-27 ENCOUNTER — Other Ambulatory Visit: Payer: No Typology Code available for payment source | Admitting: *Deleted

## 2022-07-27 ENCOUNTER — Ambulatory Visit (INDEPENDENT_AMBULATORY_CARE_PROVIDER_SITE_OTHER): Payer: No Typology Code available for payment source | Admitting: Neurology

## 2022-07-27 DIAGNOSIS — Z8659 Personal history of other mental and behavioral disorders: Secondary | ICD-10-CM

## 2022-07-27 DIAGNOSIS — R410 Disorientation, unspecified: Secondary | ICD-10-CM | POA: Diagnosis not present

## 2022-07-28 ENCOUNTER — Ambulatory Visit: Payer: No Typology Code available for payment source | Admitting: Internal Medicine

## 2022-08-03 ENCOUNTER — Telehealth: Payer: Self-pay | Admitting: Anesthesiology

## 2022-08-03 NOTE — Telephone Encounter (Signed)
Left message for pt to return call regarding EEG results.

## 2022-08-03 NOTE — Telephone Encounter (Signed)
-----   Message from Deatra James, RN sent at 08/03/2022 12:00 PM EDT -----  ----- Message ----- From: Micki Riley, MD Sent: 08/03/2022  11:55 AM EDT To: Gna-Pod 2 Results  Kindly inform the patient that EEG study was normal

## 2022-08-03 NOTE — Progress Notes (Signed)
Kindly inform the patient that EEG study was normal

## 2022-08-04 NOTE — Telephone Encounter (Signed)
Pt called back, the results off EEG were relayed to her, pt had no questions.

## 2022-08-23 ENCOUNTER — Ambulatory Visit
Admission: RE | Admit: 2022-08-23 | Discharge: 2022-08-23 | Disposition: A | Discharge status: Home / Self Care | Payer: No Typology Code available for payment source | Source: Ambulatory Visit | Attending: Neurology | Admitting: Neurology

## 2022-08-23 DIAGNOSIS — Z8659 Personal history of other mental and behavioral disorders: Secondary | ICD-10-CM | POA: Diagnosis not present

## 2022-08-23 DIAGNOSIS — I6501 Occlusion and stenosis of right vertebral artery: Secondary | ICD-10-CM | POA: Diagnosis not present

## 2022-08-23 MED ORDER — GADOPICLENOL 0.5 MMOL/ML IV SOLN
9.0000 mL | Freq: Once | INTRAVENOUS | Status: AC | PRN
  Administered 2022-08-23: 8 mL via INTRAVENOUS

## 2022-08-29 ENCOUNTER — Ambulatory Visit: Payer: No Typology Code available for payment source | Admitting: Cardiology

## 2022-08-29 ENCOUNTER — Encounter: Payer: Self-pay | Admitting: Cardiology

## 2022-08-29 VITALS — BP 120/67 | HR 70 | Ht 62.0 in | Wt 180.0 lb

## 2022-08-29 DIAGNOSIS — I6523 Occlusion and stenosis of bilateral carotid arteries: Secondary | ICD-10-CM

## 2022-08-29 DIAGNOSIS — I10 Essential (primary) hypertension: Secondary | ICD-10-CM

## 2022-08-29 DIAGNOSIS — E1122 Type 2 diabetes mellitus with diabetic chronic kidney disease: Secondary | ICD-10-CM | POA: Diagnosis not present

## 2022-08-29 DIAGNOSIS — I2510 Atherosclerotic heart disease of native coronary artery without angina pectoris: Secondary | ICD-10-CM | POA: Diagnosis not present

## 2022-08-29 DIAGNOSIS — N1831 Chronic kidney disease, stage 3a: Secondary | ICD-10-CM | POA: Diagnosis not present

## 2022-08-29 NOTE — Progress Notes (Signed)
Primary Physician/Referring:  Lewis Moccasin, MD  Patient ID: Debbie Gonzales, female    DOB: 11/01/1950, 72 y.o.   MRN: [id number]  Chief Complaint  Patient presents with   Coronary Artery Disease   Follow-up   Hypertension   carodit bruit   HPI:    Debbie Gonzales  is a 72 y.o. African-American female patient with coronary artery disease and stent implantation in 2012 while in [place], hypertension, hypercholesterolemia, diabetes mellitus with stage IIIa chronic kidney disease presenting with episodes of global amnesia for which she underwent CT angiogram of the brain which revealed bilateral ICA stenosis and high-grade tandem right vertebral stenosis, established with me 6 weeks ago for management of coronary artery disease and also vascular risk factors.  She remains asymptomatic.  And has been walking much at all  Past Medical History:  Diagnosis Date   Coronary artery disease    Diabetes mellitus without complication (HCC)    Heart disease    Hyperlipidemia    Hypertension    Past Surgical History:  Procedure Laterality Date   ABDOMINAL HYSTERECTOMY     CARDIAC CATHETERIZATION     CATARACT EXTRACTION N/A    KNEE SURGERY Left    Family History  Problem Relation Age of Onset   Heart failure Mother    Heart attack Mother    Diabetes Mother     Social History   Tobacco Use   Smoking status: Never   Smokeless tobacco: Never  Substance Use Topics   Alcohol use: Yes    Comment: occasionally   Marital Status: Single  ROS  Review of Systems  Cardiovascular:  Negative for chest pain, dyspnea on exertion and leg swelling.   Objective      08/29/2022   10:29 AM 07/19/2022    3:40 PM 07/19/2022    2:02 PM  Vitals with BMI  Height 5\' 2"   5\' 2"   Weight 180 lbs  187 lbs 10 oz  BMI 32.91  34.3  Systolic 120 136 409  Diastolic 67 76 75  Pulse 70 78 78   SpO2: 99 %  Physical Exam Neck:     Vascular: Carotid bruit (right) present. No JVD.   Cardiovascular:     Rate and Rhythm: Normal rate and regular rhythm.     Pulses: Intact distal pulses.     Heart sounds: Normal heart sounds. No murmur heard.    No gallop.  Pulmonary:     Effort: Pulmonary effort is normal.     Breath sounds: Normal breath sounds.  Abdominal:     General: Bowel sounds are normal.     Palpations: Abdomen is soft.  Musculoskeletal:     Right lower leg: No edema.     Left lower leg: No edema.    Laboratory examination:   Lab Results  Component Value Date   NA 144 07/07/2022   K 3.4 (L) 07/07/2022   CO2 23 07/07/2022   GLUCOSE 159 (H) 07/07/2022   BUN 16 07/07/2022   CREATININE 1.12 (H) 07/07/2022   CALCIUM 9.5 07/07/2022   EGFR 52 (L) 07/07/2022    Recent Labs    07/07/22 1623  NA 144  K 3.4*  CL 104  CO2 23  GLUCOSE 159*  BUN 16  CREATININE 1.12*  CALCIUM 9.5    Lab Results  Component Value Date   GLUCOSE 159 (H) 07/07/2022   NA 144 07/07/2022   K 3.4 (L) 07/07/2022   CL 104 07/07/2022  CO2 23 07/07/2022   BUN 16 07/07/2022   CREATININE 1.12 (H) 07/07/2022   EGFR 52 (L) 07/07/2022   CALCIUM 9.5 07/07/2022   PROT 6.1 07/07/2022   ALBUMIN 4.1 07/07/2022   LABGLOB 2.0 07/07/2022   AGRATIO 2.1 07/07/2022   BILITOT <0.2 07/07/2022   ALKPHOS 61 07/07/2022   AST 23 07/07/2022   ALT 14 07/07/2022      Lab Results  Component Value Date   ALT 14 07/07/2022   AST 23 07/07/2022   ALKPHOS 61 07/07/2022   BILITOT <0.2 07/07/2022       Latest Ref Rng & Units 07/07/2022    4:23 PM  Hepatic Function  Total Protein 6.0 - 8.5 g/dL 6.1   Albumin 3.8 - 4.8 g/dL 4.1   AST 0 - 40 IU/L 23   ALT 0 - 32 IU/L 14   Alk Phosphatase 44 - 121 IU/L 61   Total Bilirubin 0.0 - 1.2 mg/dL <4.0     Lipid Panel Recent Labs    07/07/22 1623  CHOL 137  TRIG 78  LDLCALC 69  HDL 53  CHOLHDL 2.6    HEMOGLOBIN A1C Lab Results  Component Value Date   HGBA1C 7.1 (H) 07/07/2022   No results found for: "TSH"   External labs:    Labs 06/21/2022:  Serum glucose 132 mg, sodium 142, potassium 3.8, BUN 20, creatinine 1.1, EGFR 53 mL, LFTs normal.  Labs 05/03/2022:  Total cholesterol 152, triglycerides 97, HDL 44, LDL 89.  Hb 11.4/HCT 34.0, platelets 303.  Vitamin D 55.5.  TSH normal at 0.58.  Radiology:   CT angio head 06/21/2022: 1. Intracranial atherosclerosis including severe tandem stenoses of the dominant right vertebral artery. 2. Moderate left and mild right carotid siphon stenoses. 3. Mild-to-moderate bilateral MCA stenoses.  Cardiac Studies:  Coronary artery disease and S/P OM-1stent 2.75x28 mm Xience)implantation in 2015  Regadenoson nuclear stress test 01/17/2019: Perfusion: No decreased activity in the left ventricle on stress  imaging to suggest reversible ischemia or infarction.  Wall Motion: Normal left ventricular wall motion. No left  ventricular dilation. Left Ventricular Ejection Fraction: 68 %   Carotid artery duplex 07/21/2022: Duplex suggests stenosis in the right internal carotid artery (1-15%). < 50% stenosis in the right external carotid artery. Duplex suggests stenosis in the left internal carotid artery (1-15%). <50% stenosis in the left external carotid artery. Mild diffuse homogeneous plaque noted in the bilateral carotid arteries. No significant stenosis noted in the extracranial cerebral vessels. Antegrade right vertebral artery flow. Antegrade left vertebral artery flow.  EKG:   EKG 07/19/2022: Normal sinus rhythm at the rate of 78 bpm, left atrial enlargement, normal axis.  Right bundle branch block.   Medications and allergies  No Known Allergies   Medication list   Current Outpatient Medications:    aspirin EC 81 MG tablet, Take 81 mg by mouth daily., Disp: , Rfl:    atorvastatin (LIPITOR) 40 MG tablet, Take 40 mg by mouth daily., Disp: , Rfl:    carvedilol (COREG) 6.25 MG tablet, Take 1 tablet (6.25 mg total) by mouth 2 (two) times daily., Disp: 180 tablet, Rfl:  3   Cholecalciferol (VITAMIN D3) 25 MCG (1000 UT) CAPS, Take 1 capsule by mouth daily., Disp: , Rfl:    fluticasone (FLONASE) 50 MCG/ACT nasal spray, Place into both nostrils., Disp: , Rfl:    hydrochlorothiazide (HYDRODIURIL) 25 MG tablet, Take 25 mg by mouth daily., Disp: , Rfl:    Lancet Devices [facility] [employer]  PLUS LANCING) MISC, , Disp: , Rfl:    Lancets (ONETOUCH DELICA PLUS LANCET33G) MISC, Apply topically., Disp: , Rfl:    losartan (COZAAR) 100 MG tablet, Take 100 mg by mouth daily., Disp: , Rfl:    metFORMIN (GLUCOPHAGE) 1000 MG tablet, Take 1,000 mg by mouth daily., Disp: , Rfl:    ONETOUCH ULTRA test strip, , Disp: , Rfl:    pioglitazone (ACTOS) 15 MG tablet, Take 1 tablet (15 mg total) by mouth daily., Disp: , Rfl:    Assessment     ICD-10-CM   1. Coronary artery disease involving native coronary artery of native heart without angina pectoris  I25.10     2. Primary hypertension  I10     3. Asymptomatic bilateral carotid artery stenosis  I65.23     4. 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   No orders of the defined types were placed in this encounter.   There are no discontinued medications.    Recommendations:   Debbie Gonzales is a 72 y.o.African-American female patient with coronary artery disease and S/P OM-1stent 2.75x28 mm Xience)implantation in 2015 while in [place], hypertension, hypercholesterolemia, diabetes mellitus with stage IIIa chronic kidney disease presenting with episodes of global amnesia for which she underwent CT angiogram of the brain which revealed bilateral ICA stenosis and high-grade tandem right vertebral stenosis.   1. Coronary artery disease involving native coronary artery of native heart without angina pectoris Patient has had what appears to be NSTEMI and underwent  angioplasty and stenting in 2012 while in [place], details not available, patient referred to me for establishing cardiac care.  She remains angina free, she has had a nuclear stress test in 2020 which was nonischemic with no wall motion abnormality. Her EKG is normal, blood pressure is elevated and also for cardiovascular protection will increase her carvedilol from 3.125 mg to 6.25 mg p.o. twice daily. - EKG 12-Lead - carvedilol (COREG) 6.25 MG tablet; Take 1 tablet (6.25 mg total) by mouth 2 (two) times daily.  Dispense: 180 tablet; Refill: 3  2. Primary hypertension On her last office visit blood pressure was elevated, I did increase the dose of carvedilol to 6.25 mg p.o. twice daily which she is tolerating, blood pressure under excellent control. 3.  Asymptomatic bilateral carotid artery stenosis  I reviewed her carotid artery duplex, she has no extracerebral carotid disease but has significant plaque burden in the intracerebral vessels and also carotid siphon, continue primary prevention with aggressive risk modification is indicated.  She is already on high intensity statin, lipids under excellent control.  4. Type 2 diabetes mellitus with stage 3a chronic kidney disease, without long-term current use of insulin (HCC) Patient has stage IIIa chronic kidney disease, renal function has remained stable.  She is presently on 100 mg of losartan, could consider addition of Jardiance 25 mg daily or Farxiga 10 mg daily both for diabetes mellitus and chronic kidney disease and consider discontinuing either metformin or pioglitazone.  She has otherwise done remarkably well, remains asymptomatic with regard to CAD, I will see her back on annual basis.   Yates Decamp, MD, [facility] 08/29/2022, 10:50 AM Office: [phone]

## 2022-09-02 NOTE — Progress Notes (Signed)
Kindly inform patient that brain MRI was normal

## 2022-09-05 ENCOUNTER — Telehealth: Payer: Self-pay

## 2022-09-05 NOTE — Telephone Encounter (Signed)
-----   Message from Micki Riley, MD sent at 09/02/2022  7:32 AM EDT ----- Debbie Gonzales inform patient that brain MRI was normal

## 2022-09-05 NOTE — Telephone Encounter (Signed)
Pt called, message from Katherine, RN was relayed. 

## 2022-09-05 NOTE — Telephone Encounter (Signed)
Called and LVM rq CB If patient returns call please relay MRI was normal

## 2022-09-06 NOTE — Telephone Encounter (Signed)
Called patient and informed her per Dr Pearlean Brownie " Kindly inform patient that brain MRI was normal" Pt verbalized understanding. Patient ask for a copy that I will place at front, so she is maybe to give her insurance a copy.

## 2022-09-07 ENCOUNTER — Other Ambulatory Visit: Payer: Self-pay | Admitting: Nurse Practitioner

## 2022-09-07 ENCOUNTER — Ambulatory Visit
Admission: RE | Admit: 2022-09-07 | Discharge: 2022-09-07 | Disposition: A | Discharge status: Home / Self Care | Payer: No Typology Code available for payment source | Source: Ambulatory Visit | Attending: Nurse Practitioner | Admitting: Nurse Practitioner

## 2022-09-07 DIAGNOSIS — M79671 Pain in right foot: Secondary | ICD-10-CM

## 2022-09-07 DIAGNOSIS — M79675 Pain in left toe(s): Secondary | ICD-10-CM | POA: Diagnosis not present

## 2022-09-07 DIAGNOSIS — M7731 Calcaneal spur, right foot: Secondary | ICD-10-CM | POA: Diagnosis not present

## 2022-09-07 DIAGNOSIS — M2011 Hallux valgus (acquired), right foot: Secondary | ICD-10-CM | POA: Diagnosis not present

## 2022-09-12 DIAGNOSIS — I10 Essential (primary) hypertension: Secondary | ICD-10-CM | POA: Diagnosis not present

## 2022-09-12 DIAGNOSIS — E1122 Type 2 diabetes mellitus with diabetic chronic kidney disease: Secondary | ICD-10-CM | POA: Diagnosis not present

## 2022-09-21 DIAGNOSIS — I10 Essential (primary) hypertension: Secondary | ICD-10-CM | POA: Diagnosis not present

## 2022-09-21 DIAGNOSIS — E1165 Type 2 diabetes mellitus with hyperglycemia: Secondary | ICD-10-CM | POA: Diagnosis not present

## 2022-09-21 DIAGNOSIS — E782 Mixed hyperlipidemia: Secondary | ICD-10-CM | POA: Diagnosis not present

## 2022-09-21 DIAGNOSIS — E1169 Type 2 diabetes mellitus with other specified complication: Secondary | ICD-10-CM | POA: Diagnosis not present

## 2022-10-17 DIAGNOSIS — Z961 Presence of intraocular lens: Secondary | ICD-10-CM | POA: Diagnosis not present

## 2022-10-17 DIAGNOSIS — H52223 Regular astigmatism, bilateral: Secondary | ICD-10-CM | POA: Diagnosis not present

## 2022-10-17 DIAGNOSIS — E119 Type 2 diabetes mellitus without complications: Secondary | ICD-10-CM | POA: Diagnosis not present

## 2022-10-19 ENCOUNTER — Telehealth: Payer: Self-pay | Admitting: Neurology

## 2022-10-19 ENCOUNTER — Encounter: Payer: Self-pay | Admitting: Neurology

## 2022-10-19 NOTE — Telephone Encounter (Signed)
LVM and sent letter in mail informing pt of need to reschedule 02/01/23 appt - office closing early for holiday

## 2022-12-13 DIAGNOSIS — E785 Hyperlipidemia, unspecified: Secondary | ICD-10-CM | POA: Diagnosis not present

## 2022-12-13 DIAGNOSIS — E1165 Type 2 diabetes mellitus with hyperglycemia: Secondary | ICD-10-CM | POA: Diagnosis not present

## 2022-12-13 DIAGNOSIS — I10 Essential (primary) hypertension: Secondary | ICD-10-CM | POA: Diagnosis not present

## 2022-12-13 DIAGNOSIS — R5383 Other fatigue: Secondary | ICD-10-CM | POA: Diagnosis not present

## 2022-12-20 DIAGNOSIS — Z23 Encounter for immunization: Secondary | ICD-10-CM | POA: Diagnosis not present

## 2022-12-20 DIAGNOSIS — E1165 Type 2 diabetes mellitus with hyperglycemia: Secondary | ICD-10-CM | POA: Diagnosis not present

## 2022-12-20 DIAGNOSIS — E782 Mixed hyperlipidemia: Secondary | ICD-10-CM | POA: Diagnosis not present

## 2022-12-20 DIAGNOSIS — E1122 Type 2 diabetes mellitus with diabetic chronic kidney disease: Secondary | ICD-10-CM | POA: Diagnosis not present

## 2022-12-20 DIAGNOSIS — E1159 Type 2 diabetes mellitus with other circulatory complications: Secondary | ICD-10-CM | POA: Diagnosis not present

## 2022-12-20 DIAGNOSIS — Z789 Other specified health status: Secondary | ICD-10-CM | POA: Diagnosis not present

## 2022-12-20 DIAGNOSIS — F411 Generalized anxiety disorder: Secondary | ICD-10-CM | POA: Diagnosis not present

## 2022-12-20 DIAGNOSIS — I10 Essential (primary) hypertension: Secondary | ICD-10-CM | POA: Diagnosis not present

## 2022-12-26 ENCOUNTER — Ambulatory Visit: Payer: No Typology Code available for payment source | Admitting: Podiatry

## 2023-01-04 ENCOUNTER — Encounter: Payer: Self-pay | Admitting: Podiatry

## 2023-01-04 ENCOUNTER — Ambulatory Visit (INDEPENDENT_AMBULATORY_CARE_PROVIDER_SITE_OTHER): Payer: Medicare HMO | Admitting: Podiatry

## 2023-01-04 VITALS — Ht 62.0 in | Wt 180.0 lb

## 2023-01-04 DIAGNOSIS — E1165 Type 2 diabetes mellitus with hyperglycemia: Secondary | ICD-10-CM

## 2023-01-04 DIAGNOSIS — M79674 Pain in right toe(s): Secondary | ICD-10-CM | POA: Diagnosis not present

## 2023-01-04 DIAGNOSIS — B351 Tinea unguium: Secondary | ICD-10-CM | POA: Diagnosis not present

## 2023-01-04 DIAGNOSIS — M79675 Pain in left toe(s): Secondary | ICD-10-CM

## 2023-01-04 DIAGNOSIS — I739 Peripheral vascular disease, unspecified: Secondary | ICD-10-CM | POA: Insufficient documentation

## 2023-01-04 NOTE — Progress Notes (Signed)
Subjective:  Patient ID: Debbie Gonzales, female    DOB: Jun 12, 1950,   MRN: [id number]  Chief Complaint  Patient presents with   Nail Problem    DFC    72 y.o. female presents for concern of thickened elongated and painful nails that are difficult to trim. Requesting to have them trimmed today. Relates burning and tingling in their feet. Patient is diabetic and last A1c was  Lab Results  Component Value Date   HGBA1C 7.1 (H) 07/07/2022   .   PCP:  Lewis Moccasin, MD    . Denies any other pedal complaints. Denies n/v/f/c.   Past Medical History:  Diagnosis Date   Coronary artery disease    Diabetes mellitus without complication (HCC)    Heart disease    Hyperlipidemia    Hypertension     Objective:  Physical Exam: Vascular: DP/PT pulses 2/4 bilateral. CFT <3 seconds. Absent hair growth on digits. Edema noted to bilateral lower extremities. Xerosis noted bilaterally.  Skin. No lacerations or abrasions bilateral feet. Nails 1-5 bilateral  are thickened discolored and elongated with subungual debris.  Musculoskeletal: MMT 5/5 bilateral lower extremities in DF, PF, Inversion and Eversion. Deceased ROM in DF of ankle joint.  Neurological: Sensation intact to light touch. Protective sensation diminished bilateral.    Assessment:   1. Pain due to onychomycosis of toenails of both feet   2. Peripheral vascular disease (HCC)   3. Type 2 diabetes mellitus with hyperglycemia, without long-term current use of insulin (HCC)      Plan:  Patient was evaluated and treated and all questions answered. -Discussed and educated patient on diabetic foot care, especially with  regards to the vascular, neurological and musculoskeletal systems.  -Stressed the importance of good glycemic control and the detriment of not  controlling glucose levels in relation to the foot. -Discussed supportive shoes at all times and checking feet regularly.  -Mechanically debrided all nails 1-5  bilateral using sterile nail nipper and filed with dremel without incident  -Answered all patient questions -Patient to return  in 3 months for at risk foot care -Patient advised to call the office if any problems or questions arise in the meantime.    Louann Sjogren, DPM

## 2023-01-11 DIAGNOSIS — G4700 Insomnia, unspecified: Secondary | ICD-10-CM | POA: Diagnosis not present

## 2023-01-11 DIAGNOSIS — F411 Generalized anxiety disorder: Secondary | ICD-10-CM | POA: Diagnosis not present

## 2023-01-11 DIAGNOSIS — I10 Essential (primary) hypertension: Secondary | ICD-10-CM | POA: Diagnosis not present

## 2023-01-11 DIAGNOSIS — F331 Major depressive disorder, recurrent, moderate: Secondary | ICD-10-CM | POA: Diagnosis not present

## 2023-02-01 ENCOUNTER — Ambulatory Visit: Payer: No Typology Code available for payment source | Admitting: Neurology

## 2023-03-13 ENCOUNTER — Telehealth: Payer: Self-pay | Admitting: Neurology

## 2023-03-13 NOTE — Telephone Encounter (Signed)
 Pt rescheduled appt due to have to baby sit.

## 2023-03-14 ENCOUNTER — Ambulatory Visit: Payer: No Typology Code available for payment source | Admitting: Neurology

## 2023-03-21 DIAGNOSIS — R739 Hyperglycemia, unspecified: Secondary | ICD-10-CM | POA: Diagnosis not present

## 2023-03-24 DIAGNOSIS — J069 Acute upper respiratory infection, unspecified: Secondary | ICD-10-CM | POA: Diagnosis not present

## 2023-03-24 DIAGNOSIS — B9689 Other specified bacterial agents as the cause of diseases classified elsewhere: Secondary | ICD-10-CM | POA: Diagnosis not present

## 2023-03-24 DIAGNOSIS — J329 Chronic sinusitis, unspecified: Secondary | ICD-10-CM | POA: Diagnosis not present

## 2023-03-24 DIAGNOSIS — J189 Pneumonia, unspecified organism: Secondary | ICD-10-CM | POA: Diagnosis not present

## 2023-03-27 DIAGNOSIS — J069 Acute upper respiratory infection, unspecified: Secondary | ICD-10-CM | POA: Diagnosis not present

## 2023-03-28 DIAGNOSIS — J40 Bronchitis, not specified as acute or chronic: Secondary | ICD-10-CM | POA: Diagnosis not present

## 2023-03-28 DIAGNOSIS — N1831 Chronic kidney disease, stage 3a: Secondary | ICD-10-CM | POA: Diagnosis not present

## 2023-03-28 DIAGNOSIS — I10 Essential (primary) hypertension: Secondary | ICD-10-CM | POA: Diagnosis not present

## 2023-03-28 DIAGNOSIS — E1121 Type 2 diabetes mellitus with diabetic nephropathy: Secondary | ICD-10-CM | POA: Diagnosis not present

## 2023-03-28 DIAGNOSIS — E1165 Type 2 diabetes mellitus with hyperglycemia: Secondary | ICD-10-CM | POA: Diagnosis not present

## 2023-03-28 DIAGNOSIS — E1143 Type 2 diabetes mellitus with diabetic autonomic (poly)neuropathy: Secondary | ICD-10-CM | POA: Diagnosis not present

## 2023-03-28 DIAGNOSIS — S7000XA Contusion of unspecified hip, initial encounter: Secondary | ICD-10-CM | POA: Diagnosis not present

## 2023-03-28 DIAGNOSIS — E1122 Type 2 diabetes mellitus with diabetic chronic kidney disease: Secondary | ICD-10-CM | POA: Diagnosis not present

## 2023-04-06 DIAGNOSIS — F331 Major depressive disorder, recurrent, moderate: Secondary | ICD-10-CM | POA: Diagnosis not present

## 2023-04-06 DIAGNOSIS — G4700 Insomnia, unspecified: Secondary | ICD-10-CM | POA: Diagnosis not present

## 2023-04-06 DIAGNOSIS — F411 Generalized anxiety disorder: Secondary | ICD-10-CM | POA: Diagnosis not present

## 2023-04-10 ENCOUNTER — Ambulatory Visit: Payer: Medicare HMO | Admitting: Podiatry

## 2023-04-10 ENCOUNTER — Encounter: Payer: Self-pay | Admitting: Podiatry

## 2023-04-10 DIAGNOSIS — B351 Tinea unguium: Secondary | ICD-10-CM

## 2023-04-10 DIAGNOSIS — M2042 Other hammer toe(s) (acquired), left foot: Secondary | ICD-10-CM

## 2023-04-10 DIAGNOSIS — M19071 Primary osteoarthritis, right ankle and foot: Secondary | ICD-10-CM

## 2023-04-10 DIAGNOSIS — M21611 Bunion of right foot: Secondary | ICD-10-CM

## 2023-04-10 DIAGNOSIS — E1165 Type 2 diabetes mellitus with hyperglycemia: Secondary | ICD-10-CM | POA: Diagnosis not present

## 2023-04-10 DIAGNOSIS — M2041 Other hammer toe(s) (acquired), right foot: Secondary | ICD-10-CM

## 2023-04-10 DIAGNOSIS — M19072 Primary osteoarthritis, left ankle and foot: Secondary | ICD-10-CM

## 2023-04-10 DIAGNOSIS — E1142 Type 2 diabetes mellitus with diabetic polyneuropathy: Secondary | ICD-10-CM

## 2023-04-10 DIAGNOSIS — I739 Peripheral vascular disease, unspecified: Secondary | ICD-10-CM | POA: Diagnosis not present

## 2023-04-10 DIAGNOSIS — M79675 Pain in left toe(s): Secondary | ICD-10-CM | POA: Diagnosis not present

## 2023-04-10 DIAGNOSIS — M79674 Pain in right toe(s): Secondary | ICD-10-CM | POA: Diagnosis not present

## 2023-04-10 DIAGNOSIS — M21612 Bunion of left foot: Secondary | ICD-10-CM

## 2023-04-10 NOTE — Progress Notes (Signed)
  Subjective:  Patient ID: Debbie Gonzales, female    DOB: 14-May-1950,   MRN: [id number]  Chief Complaint  Patient presents with   Nail Problem    RFC    73 y.o. female presents for concern of thickened elongated and painful nails that are difficult to trim. Requesting to have them trimmed today. Relates burning and tingling in their feet. Patient is diabetic and last A1c was  Lab Results  Component Value Date   HGBA1C 7.1 (H) 07/07/2022   .   PCP:  Lewis Moccasin, MD    . Denies any other pedal complaints. Denies n/v/f/c.   Past Medical History:  Diagnosis Date   Coronary artery disease    Diabetes mellitus without complication (HCC)    Heart disease    Hyperlipidemia    Hypertension     Objective:  Physical Exam: Vascular: DP/PT pulses 2/4 bilateral. CFT <3 seconds. Absent hair growth on digits. Edema noted to bilateral lower extremities. Xerosis noted bilaterally.  Skin. No lacerations or abrasions bilateral feet. Nails 1-5 bilateral  are thickened discolored and elongated with subungual debris.  Musculoskeletal: MMT 5/5 bilateral lower extremities in DF, PF, Inversion and Eversion. Deceased ROM in DF of ankle joint. HAV deformities bilateral and hammertoes bilateral and spurring in the midfoot noted with tenderness.  Neurological: Sensation intact to light touch. Protective sensation diminished bilateral.    Assessment:   1. Pain due to onychomycosis of toenails of both feet   2. Peripheral vascular disease (HCC)   3. Type 2 diabetes mellitus with diabetic polyneuropathy, without long-term current use of insulin (HCC)   4. Bilateral bunions   5. Hammertoe, bilateral   6. Arthritis of both midfeet      Plan:  Patient was evaluated and treated and all questions answered. -Discussed and educated patient on diabetic foot care, especially with  regards to the vascular, neurological and musculoskeletal systems.  -Stressed the importance of good glycemic control  and the detriment of not  controlling glucose levels in relation to the foot. -Discussed supportive shoes at all times and checking feet regularly.  -Mechanically debrided all nails 1-5 bilateral using sterile nail nipper and filed with dremel without incident  -Answered all patient questions -DM shoes ordered -Patient to return  in 3 months for at risk foot care -Patient advised to call the office if any problems or questions arise in the meantime.    Louann Sjogren, DPM

## 2023-04-12 DIAGNOSIS — J069 Acute upper respiratory infection, unspecified: Secondary | ICD-10-CM | POA: Diagnosis not present

## 2023-04-12 DIAGNOSIS — Z1159 Encounter for screening for other viral diseases: Secondary | ICD-10-CM | POA: Diagnosis not present

## 2023-04-12 DIAGNOSIS — J40 Bronchitis, not specified as acute or chronic: Secondary | ICD-10-CM | POA: Diagnosis not present

## 2023-04-12 DIAGNOSIS — B9689 Other specified bacterial agents as the cause of diseases classified elsewhere: Secondary | ICD-10-CM | POA: Diagnosis not present

## 2023-04-12 DIAGNOSIS — J329 Chronic sinusitis, unspecified: Secondary | ICD-10-CM | POA: Diagnosis not present

## 2023-04-12 DIAGNOSIS — I10 Essential (primary) hypertension: Secondary | ICD-10-CM | POA: Diagnosis not present

## 2023-04-25 DIAGNOSIS — Z13220 Encounter for screening for lipoid disorders: Secondary | ICD-10-CM | POA: Diagnosis not present

## 2023-04-25 DIAGNOSIS — Z0000 Encounter for general adult medical examination without abnormal findings: Secondary | ICD-10-CM | POA: Diagnosis not present

## 2023-04-28 DIAGNOSIS — Z0000 Encounter for general adult medical examination without abnormal findings: Secondary | ICD-10-CM | POA: Diagnosis not present

## 2023-04-28 DIAGNOSIS — D649 Anemia, unspecified: Secondary | ICD-10-CM | POA: Diagnosis not present

## 2023-04-28 DIAGNOSIS — R7989 Other specified abnormal findings of blood chemistry: Secondary | ICD-10-CM | POA: Diagnosis not present

## 2023-04-28 IMAGING — MG DIGITAL DIAGNOSTIC BILAT W/ TOMO W/ CAD
6 of 10 series · 6 of 26 positions shown · non-contrast
Comparison: Previous exam(s).

CLINICAL DATA: 70-year-old female for delayed six-month follow-up
of RIGHT breast calcifications (now 2 years), and for annual
bilateral mammogram.

EXAM:
DIGITAL DIAGNOSTIC BILATERAL MAMMOGRAM WITH TOMOSYNTHESIS AND CAD
TECHNIQUE: Bilateral digital diagnostic mammography and breast tomosynthesis
was performed. The images were evaluated with computer-aided
detection.

[R CC]
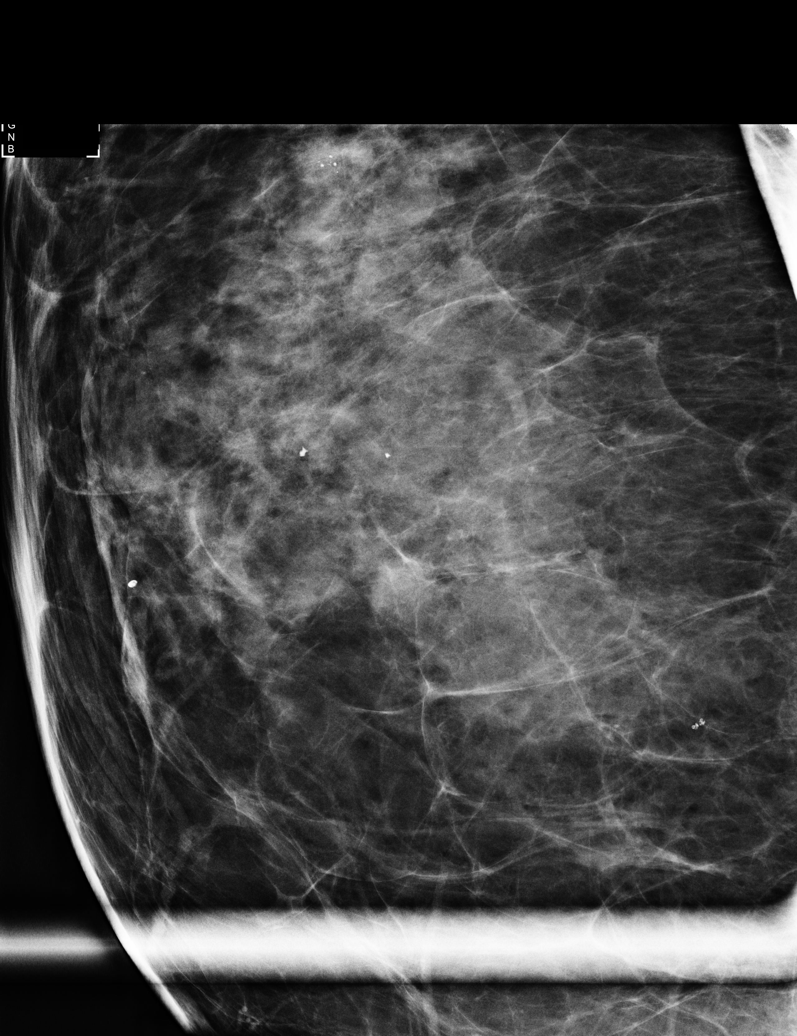

[R ML]
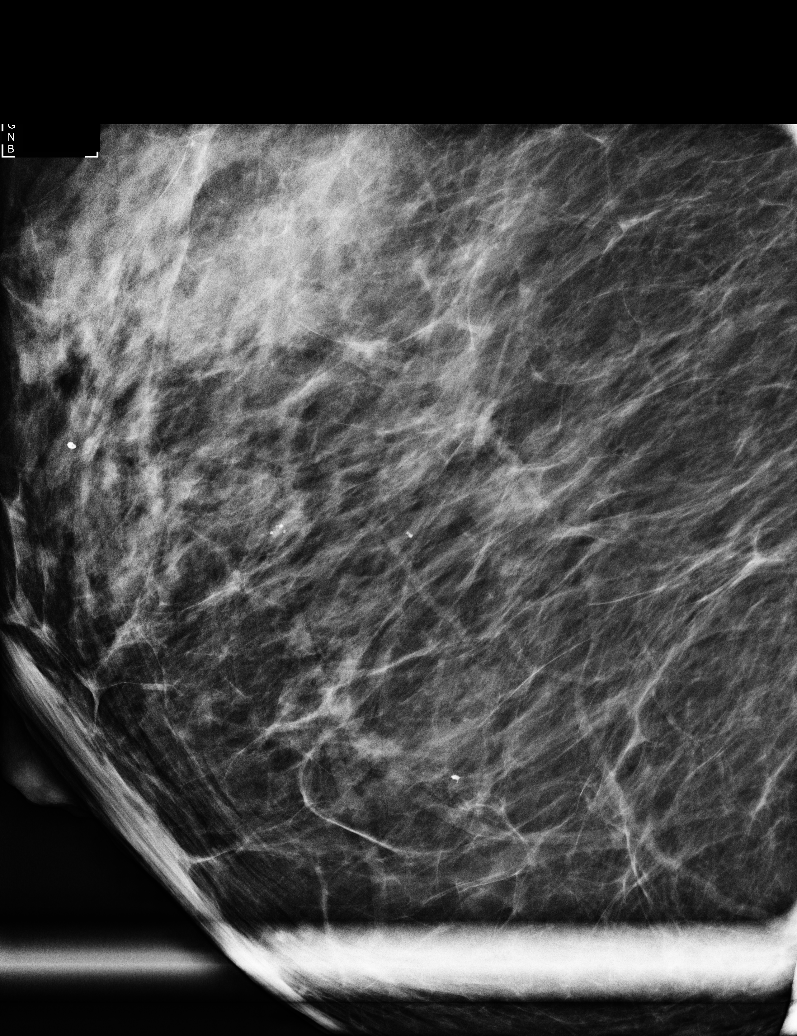

[R CC synth-2D]
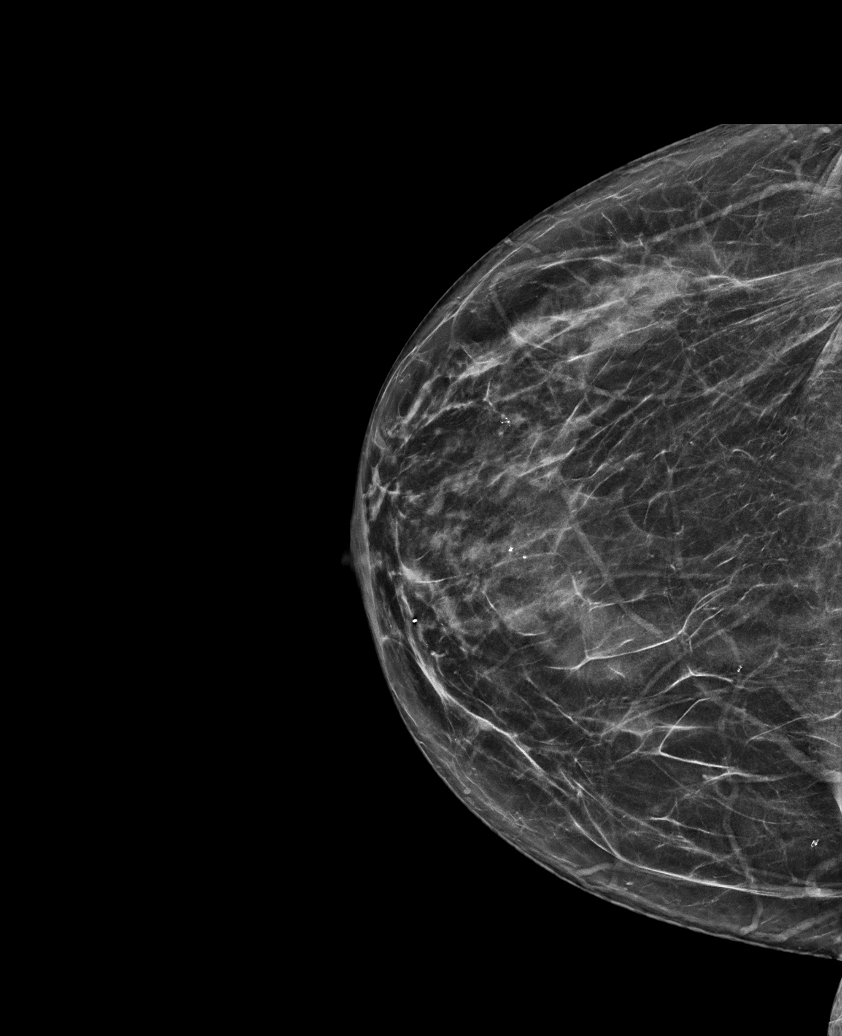

[L MLO synth-2D]
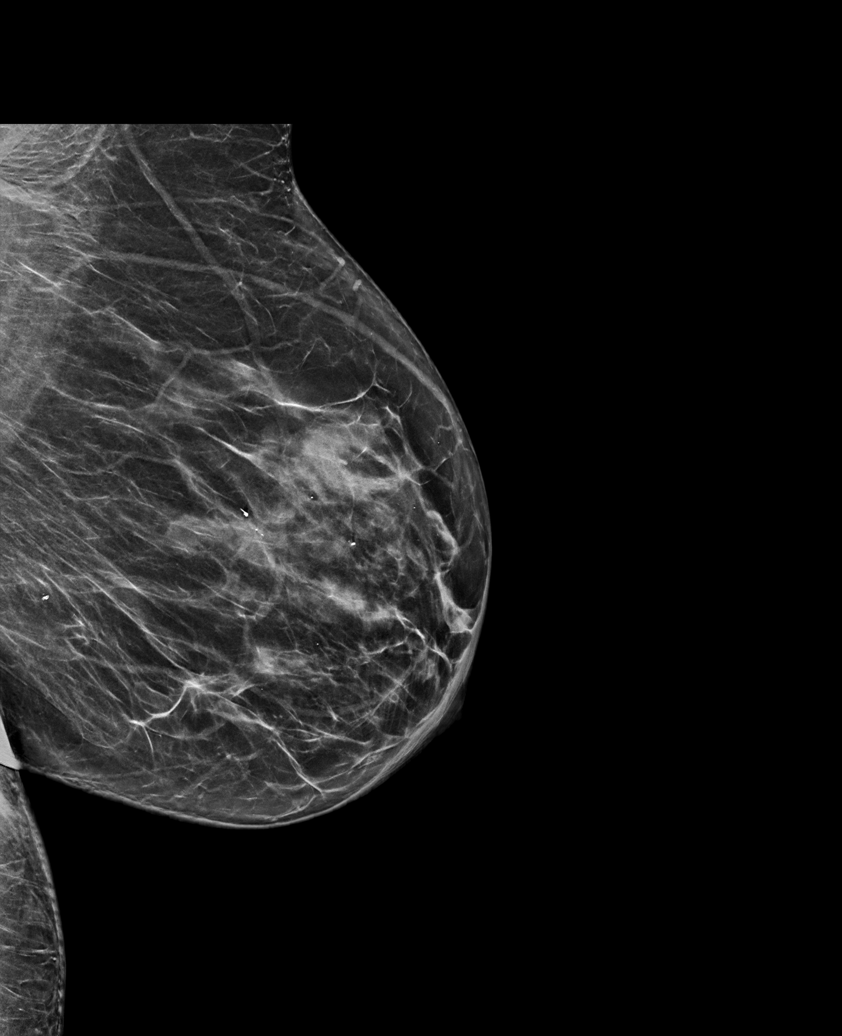

[R MLO synth-2D]
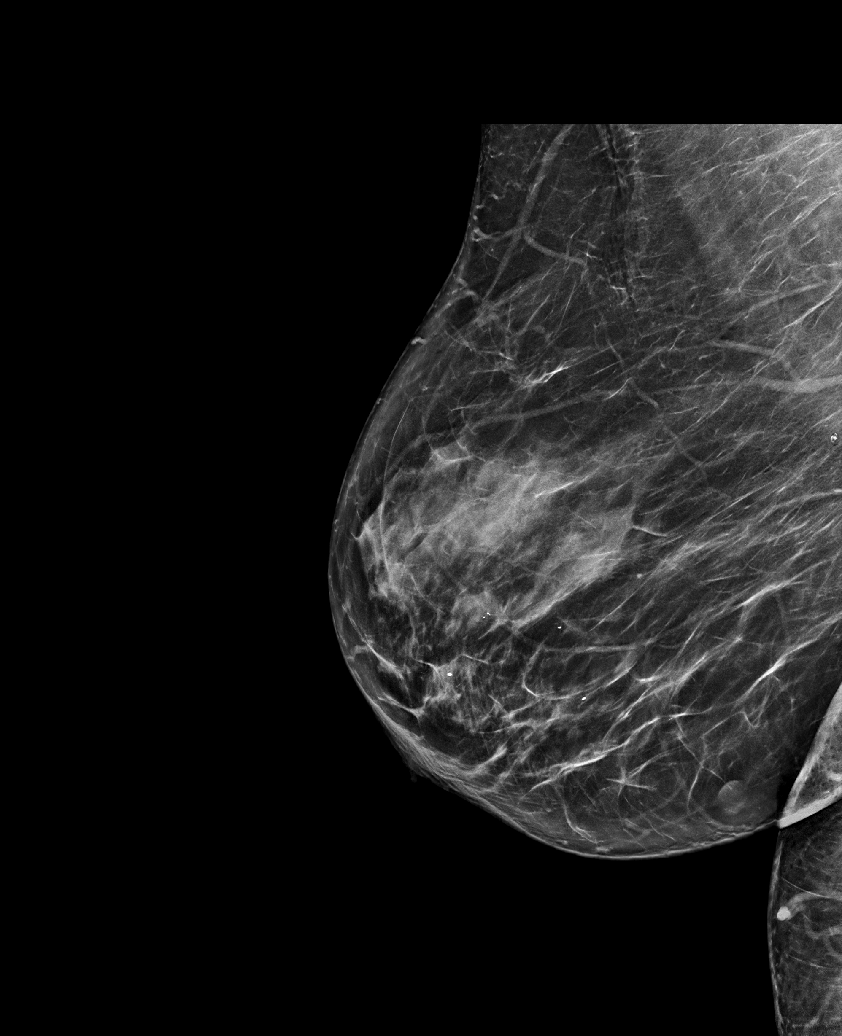

[L CC synth-2D]
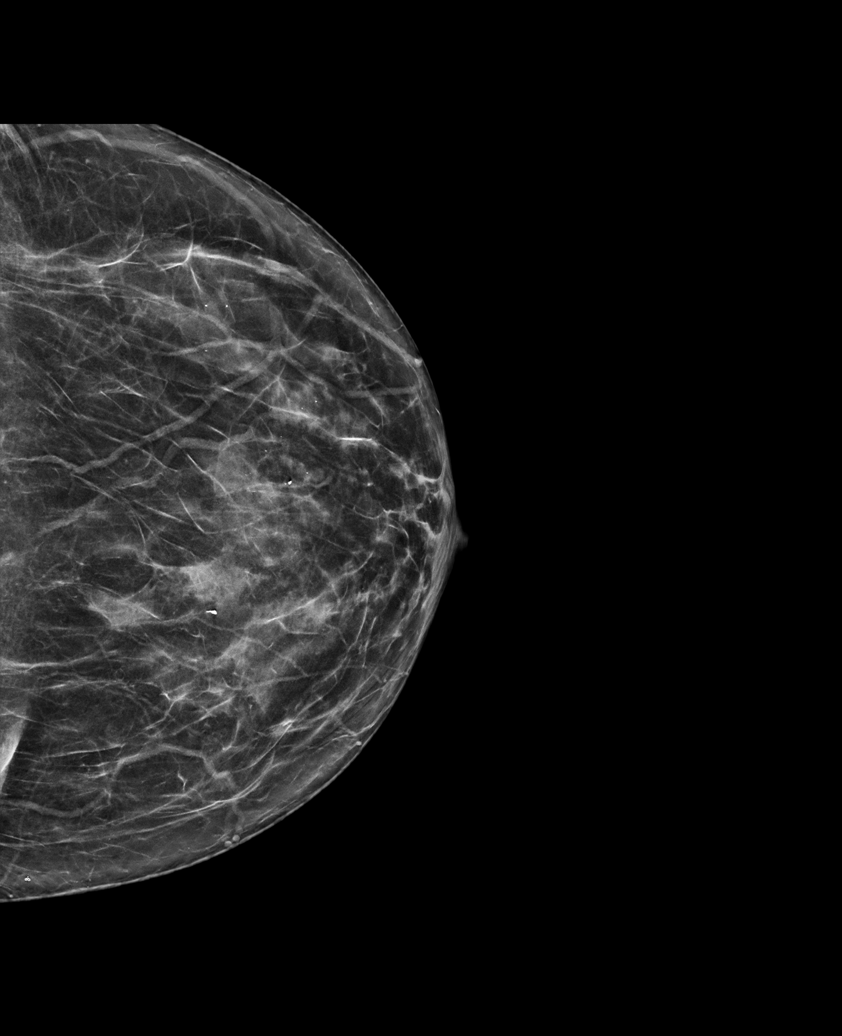

[6 of 26 positions shown; findings below may reference images not displayed]

ACR Breast Density Category c: The breast tissue is heterogeneously
dense, which may obscure small masses.
FINDINGS: Full field views of both breasts and magnification views of the
RIGHT breast demonstrate a stable 0.3 cm group of round
calcifications within the anterior UPPER-OUTER RIGHT breast.

No new or suspicious mammographic findings are noted within either
breast.
IMPRESSION: 1. Stable UPPER-OUTER RIGHT breast calcifications, unchanged for
over 2 years and compatible with a benign etiology.
2. No mammographic evidence of breast malignancy.

RECOMMENDATION:
Bilateral screening mammogram in 1 year.

I have discussed the findings and recommendations with the patient.
If applicable, a reminder letter will be sent to the patient
regarding the next appointment.

BI-RADS CATEGORY  2: Benign.

## 2023-05-01 ENCOUNTER — Ambulatory Visit: Payer: Medicare HMO

## 2023-05-01 DIAGNOSIS — M2142 Flat foot [pes planus] (acquired), left foot: Secondary | ICD-10-CM

## 2023-05-01 DIAGNOSIS — M2041 Other hammer toe(s) (acquired), right foot: Secondary | ICD-10-CM

## 2023-05-01 DIAGNOSIS — M21612 Bunion of left foot: Secondary | ICD-10-CM

## 2023-05-01 DIAGNOSIS — E1142 Type 2 diabetes mellitus with diabetic polyneuropathy: Secondary | ICD-10-CM

## 2023-05-01 NOTE — Progress Notes (Signed)
 Patient presents to the office today for diabetic shoe and insole measuring.  Patient was measured with brannock device to determine size and width for 1 pair of extra depth shoes and foam casted for 3 pair of insoles.   Documentation of medical necessity will be sent to patient's treating diabetic doctor to verify and sign.   Patient's diabetic provider: Maryelizabeth Rowan MD   Shoes and insoles will be ordered at that time and patient will be notified for an appointment for fitting when they arrive.   Shoe size (per patient): 9-9.5 Brannock measurement: 9 Shoe choice:   W860 Light Purple /[id number] Shoe size ordered: 10W  Ppw / ABN signed

## 2023-05-02 DIAGNOSIS — J329 Chronic sinusitis, unspecified: Secondary | ICD-10-CM | POA: Diagnosis not present

## 2023-05-02 DIAGNOSIS — J40 Bronchitis, not specified as acute or chronic: Secondary | ICD-10-CM | POA: Diagnosis not present

## 2023-05-02 DIAGNOSIS — J069 Acute upper respiratory infection, unspecified: Secondary | ICD-10-CM | POA: Diagnosis not present

## 2023-05-02 DIAGNOSIS — R7989 Other specified abnormal findings of blood chemistry: Secondary | ICD-10-CM | POA: Diagnosis not present

## 2023-05-02 DIAGNOSIS — I10 Essential (primary) hypertension: Secondary | ICD-10-CM | POA: Diagnosis not present

## 2023-05-02 DIAGNOSIS — D649 Anemia, unspecified: Secondary | ICD-10-CM | POA: Diagnosis not present

## 2023-05-02 DIAGNOSIS — B9689 Other specified bacterial agents as the cause of diseases classified elsewhere: Secondary | ICD-10-CM | POA: Diagnosis not present

## 2023-05-08 DIAGNOSIS — Z1331 Encounter for screening for depression: Secondary | ICD-10-CM | POA: Diagnosis not present

## 2023-05-08 DIAGNOSIS — F331 Major depressive disorder, recurrent, moderate: Secondary | ICD-10-CM | POA: Diagnosis not present

## 2023-05-08 DIAGNOSIS — Z23 Encounter for immunization: Secondary | ICD-10-CM | POA: Diagnosis not present

## 2023-05-08 DIAGNOSIS — F411 Generalized anxiety disorder: Secondary | ICD-10-CM | POA: Diagnosis not present

## 2023-05-08 DIAGNOSIS — Z0000 Encounter for general adult medical examination without abnormal findings: Secondary | ICD-10-CM | POA: Diagnosis not present

## 2023-05-08 DIAGNOSIS — Z1339 Encounter for screening examination for other mental health and behavioral disorders: Secondary | ICD-10-CM | POA: Diagnosis not present

## 2023-05-08 DIAGNOSIS — E559 Vitamin D deficiency, unspecified: Secondary | ICD-10-CM | POA: Diagnosis not present

## 2023-05-08 DIAGNOSIS — G4700 Insomnia, unspecified: Secondary | ICD-10-CM | POA: Diagnosis not present

## 2023-05-09 ENCOUNTER — Other Ambulatory Visit: Payer: Self-pay | Admitting: Family Medicine

## 2023-05-09 DIAGNOSIS — E559 Vitamin D deficiency, unspecified: Secondary | ICD-10-CM

## 2023-05-09 DIAGNOSIS — Z9189 Other specified personal risk factors, not elsewhere classified: Secondary | ICD-10-CM

## 2023-05-16 DIAGNOSIS — M2142 Flat foot [pes planus] (acquired), left foot: Secondary | ICD-10-CM | POA: Diagnosis not present

## 2023-05-16 DIAGNOSIS — Z7984 Long term (current) use of oral hypoglycemic drugs: Secondary | ICD-10-CM | POA: Diagnosis not present

## 2023-05-16 DIAGNOSIS — M2012 Hallux valgus (acquired), left foot: Secondary | ICD-10-CM | POA: Diagnosis not present

## 2023-05-16 DIAGNOSIS — M2140 Flat foot [pes planus] (acquired), unspecified foot: Secondary | ICD-10-CM | POA: Diagnosis not present

## 2023-05-16 DIAGNOSIS — M2010 Hallux valgus (acquired), unspecified foot: Secondary | ICD-10-CM | POA: Diagnosis not present

## 2023-05-16 DIAGNOSIS — E1165 Type 2 diabetes mellitus with hyperglycemia: Secondary | ICD-10-CM | POA: Diagnosis not present

## 2023-05-16 DIAGNOSIS — M2011 Hallux valgus (acquired), right foot: Secondary | ICD-10-CM | POA: Diagnosis not present

## 2023-05-25 ENCOUNTER — Ambulatory Visit: Payer: No Typology Code available for payment source | Admitting: Neurology

## 2023-05-29 DIAGNOSIS — R112 Nausea with vomiting, unspecified: Secondary | ICD-10-CM | POA: Diagnosis not present

## 2023-05-29 DIAGNOSIS — R42 Dizziness and giddiness: Secondary | ICD-10-CM | POA: Diagnosis not present

## 2023-05-29 DIAGNOSIS — G4489 Other headache syndrome: Secondary | ICD-10-CM | POA: Diagnosis not present

## 2023-05-30 ENCOUNTER — Emergency Department (HOSPITAL_COMMUNITY)

## 2023-05-30 ENCOUNTER — Emergency Department (HOSPITAL_BASED_OUTPATIENT_CLINIC_OR_DEPARTMENT_OTHER)

## 2023-05-30 ENCOUNTER — Emergency Department (HOSPITAL_BASED_OUTPATIENT_CLINIC_OR_DEPARTMENT_OTHER)
Admission: EM | Admit: 2023-05-30 | Discharge: 2023-05-30 | Disposition: A | Discharge status: Home / Self Care | Attending: Emergency Medicine | Admitting: Emergency Medicine

## 2023-05-30 ENCOUNTER — Other Ambulatory Visit: Payer: Self-pay

## 2023-05-30 DIAGNOSIS — R42 Dizziness and giddiness: Secondary | ICD-10-CM | POA: Diagnosis not present

## 2023-05-30 DIAGNOSIS — G43109 Migraine with aura, not intractable, without status migrainosus: Secondary | ICD-10-CM | POA: Diagnosis not present

## 2023-05-30 DIAGNOSIS — R29818 Other symptoms and signs involving the nervous system: Secondary | ICD-10-CM | POA: Diagnosis not present

## 2023-05-30 DIAGNOSIS — Z7982 Long term (current) use of aspirin: Secondary | ICD-10-CM | POA: Insufficient documentation

## 2023-05-30 DIAGNOSIS — I672 Cerebral atherosclerosis: Secondary | ICD-10-CM | POA: Diagnosis not present

## 2023-05-30 DIAGNOSIS — R519 Headache, unspecified: Secondary | ICD-10-CM | POA: Diagnosis not present

## 2023-05-30 DIAGNOSIS — I10 Essential (primary) hypertension: Secondary | ICD-10-CM | POA: Diagnosis not present

## 2023-05-30 LAB — BASIC METABOLIC PANEL
Anion gap: 12 (ref 5–15)
BUN: 36 mg/dL — ABNORMAL HIGH (ref 8–23)
CO2: 25 mmol/L (ref 22–32)
Calcium: 9.8 mg/dL (ref 8.9–10.3)
Chloride: 101 mmol/L (ref 98–111)
Creatinine, Ser: 1.27 mg/dL — ABNORMAL HIGH (ref 0.44–1.00)
GFR, Estimated: 45 mL/min — ABNORMAL LOW (ref >60)
Glucose, Bld: 145 mg/dL — ABNORMAL HIGH (ref 70–99)
Potassium: 3.8 mmol/L (ref 3.5–5.1)
Sodium: 138 mmol/L (ref 135–145)

## 2023-05-30 LAB — HEPATIC FUNCTION PANEL
ALT: 9 U/L (ref 0–44)
AST: 14 U/L — ABNORMAL LOW (ref 15–41)
Albumin: 4.5 g/dL (ref 3.5–5.0)
Alkaline Phosphatase: 56 U/L (ref 38–126)
Bilirubin, Direct: 0.1 mg/dL (ref 0.0–0.2)
Indirect Bilirubin: 0.3 mg/dL (ref 0.3–0.9)
Total Bilirubin: 0.4 mg/dL (ref 0.0–1.2)
Total Protein: 7.1 g/dL (ref 6.5–8.1)

## 2023-05-30 LAB — CBC
HCT: 32.7 % — ABNORMAL LOW (ref 36.0–46.0)
Hemoglobin: 11.4 g/dL — ABNORMAL LOW (ref 12.0–15.0)
MCH: 27.8 pg (ref 26.0–34.0)
MCHC: 34.9 g/dL (ref 30.0–36.0)
MCV: 79.8 fL — ABNORMAL LOW (ref 80.0–100.0)
Platelets: 285 10*3/uL (ref 150–400)
RBC: 4.1 MIL/uL (ref 3.87–5.11)
RDW: 15.1 % (ref 11.5–15.5)
WBC: 8.5 10*3/uL (ref 4.0–10.5)
nRBC: 0 % (ref 0.0–0.2)

## 2023-05-30 LAB — URINALYSIS, ROUTINE W REFLEX MICROSCOPIC
Bilirubin Urine: NEGATIVE
Glucose, UA: NEGATIVE mg/dL
Hgb urine dipstick: NEGATIVE
Ketones, ur: NEGATIVE mg/dL
Leukocytes,Ua: NEGATIVE
Nitrite: NEGATIVE
Protein, ur: NEGATIVE mg/dL
Specific Gravity, Urine: 1.014 (ref 1.005–1.030)
pH: 5.5 (ref 5.0–8.0)

## 2023-05-30 LAB — LIPASE, BLOOD: Lipase: 26 U/L (ref 11–51)

## 2023-05-30 LAB — TROPONIN I (HIGH SENSITIVITY): Troponin I (High Sensitivity): 6 ng/L (ref <18)

## 2023-05-30 MED ORDER — MECLIZINE HCL 12.5 MG PO TABS: 12.5000 mg | ORAL_TABLET | Freq: Three times a day (TID) | ORAL | 0 refills | Status: AC | PRN

## 2023-05-30 MED ORDER — MECLIZINE HCL 25 MG PO TABS
25.0000 mg | ORAL_TABLET | Freq: Once | ORAL | Status: AC
  Administered 2023-05-30: 25 mg via ORAL
  Filled 2023-05-30: qty 1

## 2023-05-30 MED ORDER — PROCHLORPERAZINE EDISYLATE 10 MG/2ML IJ SOLN
5.0000 mg | Freq: Once | INTRAMUSCULAR | Status: AC
Start: 2023-05-30 — End: 2023-05-30
  Administered 2023-05-30: 5 mg via INTRAVENOUS
  Filled 2023-05-30: qty 2

## 2023-05-30 MED ORDER — SODIUM CHLORIDE 0.9 % IV BOLUS
1000.0000 mL | Freq: Once | INTRAVENOUS | Status: AC
  Administered 2023-05-30: 1000 mL via INTRAVENOUS

## 2023-05-30 MED ORDER — DIPHENHYDRAMINE HCL 50 MG/ML IJ SOLN
12.5000 mg | Freq: Once | INTRAMUSCULAR | Status: AC
  Administered 2023-05-30: 12.5 mg via INTRAVENOUS
  Filled 2023-05-30: qty 1

## 2023-05-30 NOTE — ED Notes (Signed)
 Per provider patient is okay to go POV. CHARGE RN made aware at both locations.

## 2023-05-30 NOTE — ED Triage Notes (Addendum)
 Pt to ED from [facility] after reporting dizziness and headache since yesterday morning. LKW was 2230 on 05/28/23. Vomiting intermittently. No hx of vertigo and patient reporting she does not usually have headaches. Dizziness and nausea worse with movement.   Patient also reporting unsteadiness on her feet

## 2023-05-30 NOTE — ED Provider Notes (Signed)
  EMERGENCY DEPARTMENT AT [facility] Provider Note   CSN: [id number] Arrival date & time: 05/30/23  1427     History  Chief Complaint  Patient presents with   Dizziness   Headache    Debbie Gonzales is a 73 y.o. female.  73 yo F with a cc of dizziness and a headache.  This started not this morning but yesterday morning.  Started when she woke up.  She has had some trouble standing.  She is when she stands she feels very unsteady like she is going to fall down.  She seems to be okay in a seated position.  Denies any issues with quick head movement.  Denies chest pain denies difficulty breathing denies abdominal pain.  She has had some nausea and vomiting.  Not really eating that well the past couple days.  She went to urgent care yesterday and was given a headache cocktail.  She felt she had some improvement with it.  She had recurrence of her symptoms today had not had follow-up with her family doctor.  They did orthostatics in the office and felt she needed to come for IV fluids.  She denies any new medications with the exception of the headache cocktail.  Denies any change to her diet.  Prior to yesterday morning she was in her normal state of health.  Denied cough congestion or fever.   Dizziness Associated symptoms: headaches   Headache Associated symptoms: dizziness        Home Medications Prior to Admission medications   Medication Sig Start Date End Date Taking? Authorizing Provider  aspirin EC 81 MG tablet Take 81 mg by mouth daily. 04/06/21   [provider]  atorvastatin (LIPITOR) 40 MG tablet Take 40 mg by mouth daily. 05/13/21   [provider]  carvedilol (COREG) 6.25 MG tablet Take 1 tablet (6.25 mg total) by mouth 2 (two) times daily. 07/19/22   Yates Decamp, MD  Cholecalciferol (VITAMIN D3) 25 MCG (1000 UT) CAPS Take 1 capsule by mouth daily. 04/06/21   [provider]  fluticasone (FLONASE) 50 MCG/ACT nasal spray Place into  both nostrils. 06/01/21   [provider]  hydrochlorothiazide (HYDRODIURIL) 25 MG tablet Take 25 mg by mouth daily. 06/18/21   [provider]  Lancet Devices [facility] DELICA PLUS LANCING) MISC  06/21/21   [provider]  Lancets [facility] DELICA PLUS [place]) MISC Apply topically. 05/14/21   [provider]  losartan (COZAAR) 100 MG tablet Take 100 mg by mouth daily. 05/13/21   [provider]  metFORMIN (GLUCOPHAGE) 1000 MG tablet Take 1,000 mg by mouth daily. 06/01/21   [provider]  [facility] ULTRA test strip  06/01/21   [provider]  pioglitazone (ACTOS) 15 MG tablet Take 1 tablet (15 mg total) by mouth daily. 07/21/22   Yates Decamp, MD      Allergies    Patient has no known allergies.    Review of Systems   Review of Systems  Neurological:  Positive for dizziness and headaches.    Physical Exam Updated Vital Signs BP (!) 146/72   Pulse 75   Temp 98.9 F (37.2 C)   Resp 16   Ht 5\' 2"  (1.575 m)   Wt 81.6 kg   SpO2 100%   BMI 32.90 kg/m  Physical Exam Vitals and nursing note reviewed.  Constitutional:      General: She is not in acute distress.    Appearance: She is well-developed.  She is not diaphoretic.  HENT:     Head: Normocephalic and atraumatic.  Eyes:     Pupils: Pupils are equal, round, and reactive to light.  Cardiovascular:     Rate and Rhythm: Normal rate and regular rhythm.     Heart sounds: No murmur heard.    No friction rub. No gallop.  Pulmonary:     Effort: Pulmonary effort is normal.     Breath sounds: No wheezing or rales.  Abdominal:     General: There is no distension.     Palpations: Abdomen is soft.     Tenderness: There is no abdominal tenderness.  Musculoskeletal:        General: No tenderness.     Cervical back: Normal range of motion and neck supple.  Skin:    General: Skin is warm and dry.  Neurological:     Mental Status: She is alert and oriented to person, place,  and time.     GCS: GCS eye subscore is 4. GCS verbal subscore is 5. GCS motor subscore is 6.     Cranial Nerves: Cranial nerves 2-12 are intact.     Sensory: Sensation is intact.     Motor: Motor function is intact.     Coordination: Coordination is intact.     Comments: Upon standing the patient becomes very verbally distressed.  I do not appreciate any obvious nystagmus.  She stood and pivoted to the bed without any obvious issues.  Psychiatric:        Behavior: Behavior normal.     ED Results / Procedures / Treatments   Labs (all labs ordered are listed, but only abnormal results are displayed) Labs Reviewed  BASIC METABOLIC PANEL - Abnormal; Notable for the following components:      Result Value   Glucose, Bld 145 (*)    BUN 36 (*)    Creatinine, Ser 1.27 (*)    GFR, Estimated 45 (*)    All other components within normal limits  CBC - Abnormal; Notable for the following components:   Hemoglobin 11.4 (*)    HCT 32.7 (*)    MCV 79.8 (*)    All other components within normal limits  HEPATIC FUNCTION PANEL - Abnormal; Notable for the following components:   AST 14 (*)    All other components within normal limits  LIPASE, BLOOD  URINALYSIS, ROUTINE W REFLEX MICROSCOPIC  TROPONIN I (HIGH SENSITIVITY)    EKG EKG Interpretation Date/Time:  Tuesday May 30 2023 15:09:30 EDT Ventricular Rate:  69 PR Interval:  144 QRS Duration:  130 QT Interval:  424 QTC Calculation: 454 R Axis:   56  Text Interpretation: Normal sinus rhythm Right bundle branch block Abnormal ECG No old tracing to compare Confirmed by Melene Plan [phone]) on 05/30/2023 3:12:43 PM  Radiology CT Head Wo Contrast Result Date: 05/30/2023 CLINICAL DATA:  Headache, new onset. Dizziness. Intermittent vomiting. EXAM: CT HEAD WITHOUT CONTRAST TECHNIQUE: Contiguous axial images were obtained from the base of the skull through the vertex without intravenous contrast. RADIATION DOSE REDUCTION: This exam was performed  according to the departmental dose-optimization program which includes automated exposure control, adjustment of the mA and/or kV according to patient size and/or use of iterative reconstruction technique. COMPARISON:  06/17/2022 FINDINGS: Brain: The brain shows a normal appearance without evidence of malformation, atrophy, old or acute small or large vessel infarction, mass lesion, hemorrhage, hydrocephalus or extra-axial collection. Vascular: There is atherosclerotic calcification of the major vessels at  the base of the brain. Skull: Normal.  No traumatic finding.  No focal bone lesion. Sinuses/Orbits: Sinuses are clear. Orbits appear normal. Mastoids are clear. Other: None significant IMPRESSION: No acute CT finding. Normal appearance of the brain itself. Atherosclerotic calcification of the major vessels at the base of the brain. Electronically Signed   By: Paulina Fusi M.D.   On: 05/30/2023 17:11    Procedures Procedures    Medications Ordered in ED Medications  sodium chloride 0.9 % bolus 1,000 mL (0 mLs Intravenous Stopped 05/30/23 1818)  prochlorperazine (COMPAZINE) injection 5 mg (5 mg Intravenous Given 05/30/23 1556)  diphenhydrAMINE (BENADRYL) injection 12.5 mg (12.5 mg Intravenous Given 05/30/23 1557)  meclizine (ANTIVERT) tablet 25 mg (25 mg Oral Given 05/30/23 1557)    ED Course/ Medical Decision Making/ A&P                                 Medical Decision Making Amount and/or Complexity of Data Reviewed Labs: ordered. Radiology: ordered.  Risk Prescription drug management.   73 yo F with a chief complaint of dizziness.  This started a couple days ago.  She has been unable to stand and walk without significant assistance and when she does she feels very unsteady on her feet and is convinced that she is got a fall down.  Has not suffered any falls.  Has been seen now 3 times for this.  Urgent care gave her headache cocktail, her family doctor felt she was orthostatic and she was  sent here for evaluation.  Will obtain a laboratory evaluation bolus of IV fluids headache cocktail CT of the head reassess.  Patient feels better but still is unable to stand on her own unable to walk.  As a security acutely within 48 hours ago will send for MRI of the brain.  I discussed this with Dr. Durwin Nora who accepts the patient in ED to ED transfer.  Her CT scan of the head here is without obvious intracranial hemorrhage on my independent interpretation.  No significant electrolyte abnormalities troponin negative LFTs and lipase are unremarkable.  The patients results and plan were reviewed and discussed.   Any x-rays performed were independently reviewed by myself.   Differential diagnosis were considered with the presenting HPI.  Medications  sodium chloride 0.9 % bolus 1,000 mL (0 mLs Intravenous Stopped 05/30/23 1818)  prochlorperazine (COMPAZINE) injection 5 mg (5 mg Intravenous Given 05/30/23 1556)  diphenhydrAMINE (BENADRYL) injection 12.5 mg (12.5 mg Intravenous Given 05/30/23 1557)  meclizine (ANTIVERT) tablet 25 mg (25 mg Oral Given 05/30/23 1557)    Vitals:   05/30/23 1615 05/30/23 1730 05/30/23 1745 05/30/23 1800  BP: (!) 136/59 (!) 150/103 138/67 (!) 146/72  Pulse: 65 73 81 75  Resp:  16    Temp:      SpO2: 97% 100% 100% 100%  Weight:      Height:        Final diagnoses:  Dizziness            Final Clinical Impression(s) / ED Diagnoses Final diagnoses:  Dizziness    Rx / DC Orders ED Discharge Orders     None         Melene Plan, DO 05/30/23 1823

## 2023-05-30 NOTE — ED Notes (Signed)
 Patient currently in MRI.

## 2023-05-30 NOTE — ED Notes (Signed)
 Not able to get pt blood. Tired 2x

## 2023-05-30 NOTE — ED Provider Notes (Signed)
 Patient was transferred [facility] for evaluation of dizziness headache.  Symptoms started yesterday.  She feels especially lightheaded if she stands up or moves quickly.  Workup showed some mild elevation of creatinine.  Transferred here for MRI to evaluate for possible stroke.  She is otherwise received some meclizine and Compazine.  She said she feels a little bit better. Physical Exam  BP (!) 118/57 (BP Location: Right Arm)   Pulse 70   Temp 98.5 F (36.9 C) (Oral)   Resp 18   Ht 5\' 2"  (1.575 m)   Wt 81.6 kg   SpO2 100%   BMI 32.90 kg/m   Physical Exam  Procedures  Procedures  ED Course / MDM    Medical Decision Making Amount and/or Complexity of Data Reviewed Labs: ordered. Radiology: ordered.  Risk Prescription drug management.   9:50 PM.  MRI negative for acute stroke.  Reviewed with patient.  She is comfortable plan for discharge and trial of meclizine, close PCP follow-up.  Family are here to assist her home.       Terrilee Files, MD 05/31/23 [phone]

## 2023-05-30 NOTE — ED Notes (Signed)
 Called Kim at [facility] for transport to [facility]: Dr. Gloris Manchester accepting

## 2023-05-30 NOTE — Discharge Instructions (Addendum)
 You were seen in the emergency department for dizziness and headache.  You had lab work and a CAT scan and was transferred here to get an MRI.  Your MRI did not show any evidence of stroke or bleed.  You were stable and felt well enough for discharge.  We are prescribing you meclizine which may help the dizziness.  Please stay well-hydrated.  Follow-up with your primary care doctor.  Return to the emergency department if any worsening or concerning symptoms.

## 2023-05-31 DIAGNOSIS — I4510 Unspecified right bundle-branch block: Secondary | ICD-10-CM | POA: Diagnosis not present

## 2023-05-31 DIAGNOSIS — R001 Bradycardia, unspecified: Secondary | ICD-10-CM | POA: Diagnosis not present

## 2023-06-15 DIAGNOSIS — D649 Anemia, unspecified: Secondary | ICD-10-CM | POA: Diagnosis not present

## 2023-06-15 DIAGNOSIS — R739 Hyperglycemia, unspecified: Secondary | ICD-10-CM | POA: Diagnosis not present

## 2023-06-15 DIAGNOSIS — R5383 Other fatigue: Secondary | ICD-10-CM | POA: Diagnosis not present

## 2023-06-15 DIAGNOSIS — E559 Vitamin D deficiency, unspecified: Secondary | ICD-10-CM | POA: Diagnosis not present

## 2023-06-20 IMAGING — CR DG HIP (WITH OR WITHOUT PELVIS) 2-3V*R*
1 series · 1 of 1 positions shown · non-contrast
Comparison: None.

CLINICAL DATA: Chronic right hip pain

EXAM:
DG HIP (WITH OR WITHOUT PELVIS) 2-3V RIGHT

[w hip frog right]
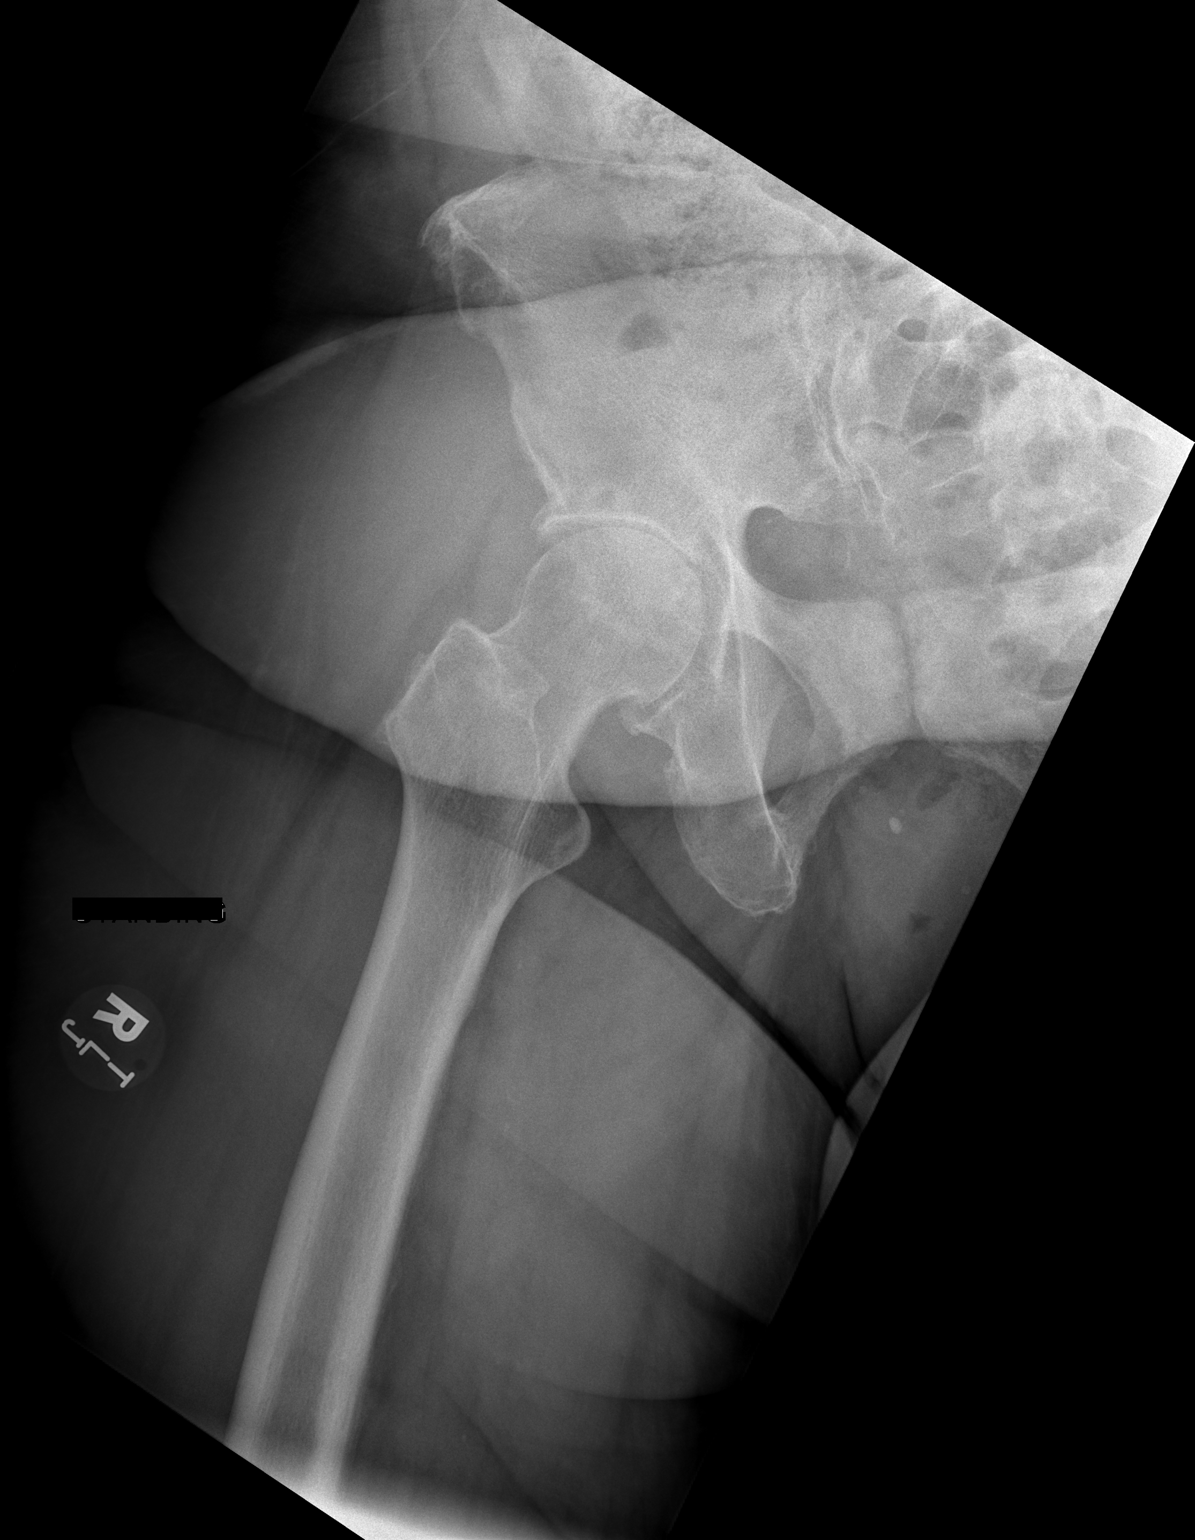

[1 of 1 positions shown; findings below may reference images not displayed]

FINDINGS: Normal alignment. No acute fracture or dislocation. Moderate right
hip degenerative arthritis with joint space narrowing. Soft tissues
are unremarkable.
IMPRESSION: Moderate right hip degenerative arthritis.

## 2023-06-22 DIAGNOSIS — Z23 Encounter for immunization: Secondary | ICD-10-CM | POA: Diagnosis not present

## 2023-06-22 DIAGNOSIS — I10 Essential (primary) hypertension: Secondary | ICD-10-CM | POA: Diagnosis not present

## 2023-06-22 DIAGNOSIS — J309 Allergic rhinitis, unspecified: Secondary | ICD-10-CM | POA: Diagnosis not present

## 2023-06-22 DIAGNOSIS — E559 Vitamin D deficiency, unspecified: Secondary | ICD-10-CM | POA: Diagnosis not present

## 2023-06-22 DIAGNOSIS — J40 Bronchitis, not specified as acute or chronic: Secondary | ICD-10-CM | POA: Diagnosis not present

## 2023-06-22 DIAGNOSIS — E1121 Type 2 diabetes mellitus with diabetic nephropathy: Secondary | ICD-10-CM | POA: Diagnosis not present

## 2023-06-22 DIAGNOSIS — E1165 Type 2 diabetes mellitus with hyperglycemia: Secondary | ICD-10-CM | POA: Diagnosis not present

## 2023-06-22 DIAGNOSIS — D509 Iron deficiency anemia, unspecified: Secondary | ICD-10-CM | POA: Diagnosis not present

## 2023-07-05 ENCOUNTER — Ambulatory Visit (INDEPENDENT_AMBULATORY_CARE_PROVIDER_SITE_OTHER)

## 2023-07-05 DIAGNOSIS — I739 Peripheral vascular disease, unspecified: Secondary | ICD-10-CM

## 2023-07-05 DIAGNOSIS — M2142 Flat foot [pes planus] (acquired), left foot: Secondary | ICD-10-CM

## 2023-07-05 DIAGNOSIS — E1142 Type 2 diabetes mellitus with diabetic polyneuropathy: Secondary | ICD-10-CM | POA: Diagnosis not present

## 2023-07-05 DIAGNOSIS — M21612 Bunion of left foot: Secondary | ICD-10-CM | POA: Diagnosis not present

## 2023-07-05 DIAGNOSIS — M21611 Bunion of right foot: Secondary | ICD-10-CM

## 2023-07-05 DIAGNOSIS — M2042 Other hammer toe(s) (acquired), left foot: Secondary | ICD-10-CM | POA: Diagnosis not present

## 2023-07-05 DIAGNOSIS — M2141 Flat foot [pes planus] (acquired), right foot: Secondary | ICD-10-CM | POA: Diagnosis not present

## 2023-07-05 DIAGNOSIS — M2041 Other hammer toe(s) (acquired), right foot: Secondary | ICD-10-CM | POA: Diagnosis not present

## 2023-07-06 NOTE — Progress Notes (Signed)

## 2023-07-10 ENCOUNTER — Ambulatory Visit (INDEPENDENT_AMBULATORY_CARE_PROVIDER_SITE_OTHER): Payer: Medicare HMO | Admitting: Podiatry

## 2023-07-10 DIAGNOSIS — Z91199 Patient's noncompliance with other medical treatment and regimen due to unspecified reason: Secondary | ICD-10-CM

## 2023-07-10 NOTE — Progress Notes (Signed)
 No show

## 2023-07-17 ENCOUNTER — Other Ambulatory Visit: Payer: Self-pay | Admitting: Cardiology

## 2023-07-17 DIAGNOSIS — I2510 Atherosclerotic heart disease of native coronary artery without angina pectoris: Secondary | ICD-10-CM

## 2023-07-17 DIAGNOSIS — I10 Essential (primary) hypertension: Secondary | ICD-10-CM

## 2023-08-09 ENCOUNTER — Other Ambulatory Visit: Payer: Self-pay | Admitting: Family Medicine

## 2023-08-09 DIAGNOSIS — Z1231 Encounter for screening mammogram for malignant neoplasm of breast: Secondary | ICD-10-CM

## 2023-08-11 ENCOUNTER — Ambulatory Visit
Admission: RE | Admit: 2023-08-11 | Discharge: 2023-08-11 | Disposition: A | Discharge status: Home / Self Care | Source: Ambulatory Visit | Attending: Family Medicine | Admitting: Family Medicine

## 2023-08-11 DIAGNOSIS — Z1231 Encounter for screening mammogram for malignant neoplasm of breast: Secondary | ICD-10-CM

## 2023-08-29 ENCOUNTER — Ambulatory Visit: Payer: Self-pay | Admitting: Cardiology

## 2023-11-08 DIAGNOSIS — R739 Hyperglycemia, unspecified: Secondary | ICD-10-CM | POA: Diagnosis not present

## 2023-11-08 DIAGNOSIS — R5383 Other fatigue: Secondary | ICD-10-CM | POA: Diagnosis not present

## 2023-11-08 DIAGNOSIS — D649 Anemia, unspecified: Secondary | ICD-10-CM | POA: Diagnosis not present

## 2023-11-10 DIAGNOSIS — E1122 Type 2 diabetes mellitus with diabetic chronic kidney disease: Secondary | ICD-10-CM | POA: Diagnosis not present

## 2023-11-10 DIAGNOSIS — F331 Major depressive disorder, recurrent, moderate: Secondary | ICD-10-CM | POA: Diagnosis not present

## 2023-11-10 DIAGNOSIS — I129 Hypertensive chronic kidney disease with stage 1 through stage 4 chronic kidney disease, or unspecified chronic kidney disease: Secondary | ICD-10-CM | POA: Diagnosis not present

## 2023-11-10 DIAGNOSIS — G4700 Insomnia, unspecified: Secondary | ICD-10-CM | POA: Diagnosis not present

## 2023-11-10 DIAGNOSIS — D509 Iron deficiency anemia, unspecified: Secondary | ICD-10-CM | POA: Diagnosis not present

## 2023-11-10 DIAGNOSIS — E1121 Type 2 diabetes mellitus with diabetic nephropathy: Secondary | ICD-10-CM | POA: Diagnosis not present

## 2023-11-10 DIAGNOSIS — F411 Generalized anxiety disorder: Secondary | ICD-10-CM | POA: Diagnosis not present

## 2023-11-10 DIAGNOSIS — E1165 Type 2 diabetes mellitus with hyperglycemia: Secondary | ICD-10-CM | POA: Diagnosis not present

## 2023-12-07 ENCOUNTER — Encounter: Payer: Self-pay | Admitting: *Deleted

## 2023-12-07 DIAGNOSIS — G4700 Insomnia, unspecified: Secondary | ICD-10-CM | POA: Diagnosis not present

## 2023-12-07 DIAGNOSIS — F411 Generalized anxiety disorder: Secondary | ICD-10-CM | POA: Diagnosis not present

## 2023-12-07 DIAGNOSIS — F331 Major depressive disorder, recurrent, moderate: Secondary | ICD-10-CM | POA: Diagnosis not present

## 2023-12-07 DIAGNOSIS — I10 Essential (primary) hypertension: Secondary | ICD-10-CM | POA: Diagnosis not present

## 2023-12-07 NOTE — Progress Notes (Signed)
 Debbie Gonzales                                          MRN: [id number]   12/07/2023   The [facility] Quality Team Specialist reviewed this patient medical record for the purposes of chart review for care gap closure. The following were reviewed: chart review for care gap closure-kidney health evaluation for diabetes:eGFR  and uACR.    [facility] Quality Team

## 2024-01-05 ENCOUNTER — Other Ambulatory Visit

## 2024-02-13 ENCOUNTER — Encounter (HOSPITAL_BASED_OUTPATIENT_CLINIC_OR_DEPARTMENT_OTHER): Payer: Self-pay

## 2024-02-13 ENCOUNTER — Inpatient Hospital Stay (HOSPITAL_BASED_OUTPATIENT_CLINIC_OR_DEPARTMENT_OTHER): Admission: RE | Admit: 2024-02-13 | Source: Ambulatory Visit

## 2024-02-23 ENCOUNTER — Encounter: Payer: Self-pay | Admitting: *Deleted

## 2024-02-23 NOTE — Progress Notes (Signed)
 NEOMIA HERBEL                                          MRN: [id number]   02/23/2024   The [facility] Quality Team Specialist reviewed this patient medical record for the purposes of chart review for care gap closure. The following were reviewed: chart review for care gap closure-controlling blood pressure and kidney health evaluation for diabetes:eGFR  and uACR.    [facility] Quality Team
